# Patient Record
Sex: Female | Born: 1980 | Race: Black or African American | Hispanic: No | Marital: Single | Smoking: Current every day smoker
Health system: Southern US, Community
[De-identification: ages and names within clinical notes are randomized; demographics above are authoritative.]

## PROBLEM LIST (undated history)

## (undated) DIAGNOSIS — R809 Proteinuria, unspecified: Secondary | ICD-10-CM

## (undated) DIAGNOSIS — I10 Essential (primary) hypertension: Secondary | ICD-10-CM

## (undated) DIAGNOSIS — E213 Hyperparathyroidism, unspecified: Secondary | ICD-10-CM

## (undated) DIAGNOSIS — N189 Chronic kidney disease, unspecified: Secondary | ICD-10-CM

## (undated) DIAGNOSIS — N183 Chronic kidney disease, stage 3 unspecified: Secondary | ICD-10-CM

## (undated) DIAGNOSIS — F419 Anxiety disorder, unspecified: Secondary | ICD-10-CM

## (undated) HISTORY — DX: Hyperparathyroidism, unspecified: E21.3

## (undated) HISTORY — DX: Chronic kidney disease, stage 3 unspecified: N18.30

## (undated) HISTORY — DX: Proteinuria, unspecified: R80.9

## (undated) HISTORY — DX: Chronic kidney disease, unspecified: N18.9

---

## 2011-10-17 DIAGNOSIS — Z8249 Family history of ischemic heart disease and other diseases of the circulatory system: Secondary | ICD-10-CM | POA: Insufficient documentation

## 2013-08-29 DIAGNOSIS — R87612 Low grade squamous intraepithelial lesion on cytologic smear of cervix (LGSIL): Secondary | ICD-10-CM

## 2013-08-29 HISTORY — DX: Low grade squamous intraepithelial lesion on cytologic smear of cervix (LGSIL): R87.612

## 2014-06-24 DIAGNOSIS — R87612 Low grade squamous intraepithelial lesion on cytologic smear of cervix (LGSIL): Secondary | ICD-10-CM | POA: Insufficient documentation

## 2019-05-13 ENCOUNTER — Encounter (HOSPITAL_COMMUNITY): Payer: Self-pay

## 2019-05-13 ENCOUNTER — Emergency Department (HOSPITAL_COMMUNITY)
Admission: EM | Admit: 2019-05-13 | Discharge: 2019-05-13 | Disposition: A | Discharge status: Home / Self Care | Payer: No Typology Code available for payment source | Attending: Emergency Medicine | Admitting: Emergency Medicine

## 2019-05-13 ENCOUNTER — Emergency Department (HOSPITAL_COMMUNITY): Payer: No Typology Code available for payment source

## 2019-05-13 ENCOUNTER — Other Ambulatory Visit: Payer: Self-pay

## 2019-05-13 DIAGNOSIS — S80212A Abrasion, left knee, initial encounter: Secondary | ICD-10-CM | POA: Insufficient documentation

## 2019-05-13 DIAGNOSIS — Y92009 Unspecified place in unspecified non-institutional (private) residence as the place of occurrence of the external cause: Secondary | ICD-10-CM | POA: Insufficient documentation

## 2019-05-13 DIAGNOSIS — S99911A Unspecified injury of right ankle, initial encounter: Secondary | ICD-10-CM

## 2019-05-13 DIAGNOSIS — Y999 Unspecified external cause status: Secondary | ICD-10-CM | POA: Diagnosis not present

## 2019-05-13 DIAGNOSIS — I10 Essential (primary) hypertension: Secondary | ICD-10-CM | POA: Insufficient documentation

## 2019-05-13 DIAGNOSIS — Y9389 Activity, other specified: Secondary | ICD-10-CM | POA: Diagnosis not present

## 2019-05-13 DIAGNOSIS — F17210 Nicotine dependence, cigarettes, uncomplicated: Secondary | ICD-10-CM | POA: Insufficient documentation

## 2019-05-13 DIAGNOSIS — X509XXA Other and unspecified overexertion or strenuous movements or postures, initial encounter: Secondary | ICD-10-CM | POA: Insufficient documentation

## 2019-05-13 HISTORY — DX: Anxiety disorder, unspecified: F41.9

## 2019-05-13 HISTORY — DX: Essential (primary) hypertension: I10

## 2019-05-13 MED ORDER — IBUPROFEN 200 MG PO TABS
600.0000 mg | ORAL_TABLET | Freq: Once | ORAL | Status: AC
  Administered 2019-05-13: 09:00:00 600 mg via ORAL
  Filled 2019-05-13: qty 3

## 2019-05-13 NOTE — ED Provider Notes (Signed)
[facility]-EMERGENCY DEPT Provider Note   CSN: [id number] Arrival date & time: 05/13/19  [id number]     History   Chief Complaint Chief Complaint  Patient presents with  . Ankle Injury    HPI Savannah Martin is a 38 y.o. female w PMHx anxiety, HTN, presenting to the ED with complaint of sudden onset of right ankle pain began this morning after she twisted her ankle stepping in a hole.  She has generalized pain to the ankle but worse on the anterior and lateral aspect.  Pain is worse with movement and palpation.  She did not hit her head or pass out.  She reports a minor abrasion to her left knee.  No medications taken for symptoms.     The history is provided by the patient.    Past Medical History:  Diagnosis Date  . Anxiety   . Hypertension     There are no active problems to display for this patient.   History reviewed. No pertinent surgical history.   OB History   No obstetric history on file.      Home Medications    Prior to Admission medications   Not on File    Family History Family History  Problem Relation Age of Onset  . Hypertension Mother   . Hypertension Father     Social History Social History   Tobacco Use  . Smoking status: Current Every Day Smoker    Packs/day: 0.50    Types: Cigarettes  . Smokeless tobacco: Never Used  Substance Use Topics  . Alcohol use: Yes    Comment: occasionally  . Drug use: Yes    Types: Marijuana    Comment: occasionally     Allergies   Patient has no known allergies.   Review of Systems Review of Systems  Musculoskeletal: Positive for arthralgias and joint swelling.  Skin: Negative for wound.  Neurological: Negative for numbness.     Physical Exam Updated Vital Signs BP (!) 163/109 (BP Location: Left Arm)   Pulse (!) 108   Temp 98 F (36.7 C) (Oral)   Resp 18   Ht 5\' 3"  (1.6 m)   Wt 79.4 kg   LMP 05/06/2019   SpO2 100%   BMI 31.00 kg/m   Physical Exam Vitals  signs and nursing note reviewed.  Constitutional:      General: She is not in acute distress.    Appearance: She is well-developed.  HENT:     Head: Normocephalic and atraumatic.  Eyes:     Conjunctiva/sclera: Conjunctivae normal.  Cardiovascular:     Rate and Rhythm: Normal rate.  Pulmonary:     Effort: Pulmonary effort is normal.  Musculoskeletal:     Comments: Right ankle with mild swelling.  No deformity or bruising.  No wounds.  There is generalized tenderness surrounding the ankle though worse in the lateral malleolus.  Pain with range of motion of the ankle.  Foot is benign.  Knee is benign.  Normal sensation intact DP pulse.  Neurological:     Mental Status: She is alert.  Psychiatric:        Mood and Affect: Mood normal.        Behavior: Behavior normal.      ED Treatments / Results  Labs (all labs ordered are listed, but only abnormal results are displayed) Labs Reviewed - No data to display  EKG None  Radiology Dg Ankle Complete Right  Result Date: 05/13/2019 CLINICAL DATA:  Acute  right ankle pain and swelling after injury today EXAM: RIGHT ANKLE - COMPLETE 3+ VIEW COMPARISON:  None. FINDINGS: There is no evidence of fracture, dislocation, or joint effusion. There is no evidence of arthropathy or other focal bone abnormality. Soft tissues are unremarkable. IMPRESSION: Negative. Electronically Signed   By: Marijo Conception M.D.   On: 05/13/2019 08:41    Procedures Procedures (including critical care time)  Medications Ordered in ED Medications  ibuprofen (ADVIL) tablet 600 mg (has no administration in time range)     Initial Impression / Assessment and Plan / ED Course  I have reviewed the triage vital signs and the nursing notes.  Pertinent labs & imaging results that were available during my care of the patient were reviewed by me and considered in my medical decision making (see chart for details).        Patient with right ankle pain likely sprain  after twisting her ankle by stepping in a hole this morning.  No head trauma or LOC.  X-rays negative for acute fracture.  Will discharge an ASO brace, crutches, recommend R ICE therapy and NSAIDs.  Outpatient follow-up.  Safe for discharge.  Discussed results, findings, treatment and follow up. Patient advised of return precautions. Patient verbalized understanding and agreed with plan.   Final Clinical Impressions(s) / ED Diagnoses   Final diagnoses:  Injury of right ankle, initial encounter    ED Discharge Orders    None       , [place] N, PA-C 05/13/19 [place], [place], DO 05/13/19 [phone]

## 2019-05-13 NOTE — ED Notes (Signed)
Ice pack applied to right ankle; pt right ankle elevated in chair.

## 2019-05-13 NOTE — Discharge Instructions (Addendum)
Please read instructions below. Apply ice to your ankle for 20 minutes at a time. Elevate it as much as possible to help with swelling. Avoid weight bearing for at least 1 week then slowly bear weight as tolerated. You can take ibuprofen every 6 hours as needed for pain. Schedule an appointment with the orthopedic specialist in 2 weeks for follow-up on your injury if symptoms persist. Return to the ER for new or concerning symptoms.

## 2019-05-13 NOTE — ED Triage Notes (Signed)
Patient states she came out of her house in the dark this AM and fell in a hole and twisted her right ankle. Patient has an abrasion to the left knee.

## 2019-05-21 ENCOUNTER — Encounter (HOSPITAL_COMMUNITY): Payer: Self-pay

## 2019-05-21 ENCOUNTER — Other Ambulatory Visit: Payer: Self-pay

## 2019-05-21 ENCOUNTER — Ambulatory Visit (HOSPITAL_COMMUNITY)
Admission: EM | Admit: 2019-05-21 | Discharge: 2019-05-21 | Disposition: A | Discharge status: Home / Self Care | Payer: PRIVATE HEALTH INSURANCE | Attending: Family Medicine | Admitting: Family Medicine

## 2019-05-21 DIAGNOSIS — S93491A Sprain of other ligament of right ankle, initial encounter: Secondary | ICD-10-CM

## 2019-05-21 DIAGNOSIS — I10 Essential (primary) hypertension: Secondary | ICD-10-CM

## 2019-05-21 MED ORDER — DICLOFENAC SODIUM 1 % TD GEL: 2.0000 g | Freq: Four times a day (QID) | TRANSDERMAL | 0 refills | Status: DC

## 2019-05-21 NOTE — Discharge Instructions (Signed)
Your blood pressure was noted to be elevated during your visit today. You may return here within the next few days to recheck if unable to see your primary care doctor. If your blood pressure remains persistently elevated, you may need to adjust your medications.  BP (!) 173/108 (BP Location: Right Arm)    Pulse (!) 101    Temp 98.3 F (36.8 C) (Oral)    Resp 16    LMP 05/06/2019    SpO2 100%

## 2019-05-21 NOTE — ED Triage Notes (Signed)
Patient presents to the UC for follow up, she sprain her right ankle 05/13/2019 and was treated at [facility] ED.

## 2019-05-21 NOTE — ED Provider Notes (Addendum)
[facility]   [id number] 05/21/19 Arrival Time: 1358  ASSESSMENT & PLAN:  1. Sprain of anterior talofibular ligament of right ankle, initial encounter   2. Essential hypertension     No indication for re-imaging ankle. Continue ASO brace. Will work more on weight bearing.  Meds ordered this encounter  Medications  . diclofenac sodium (VOLTAREN) 1 % GEL    Sig: Apply 2 g topically 4 (four) times daily.    Dispense:  100 g    Refill:  0   Recommend: Follow-up Information    Schedule an appointment as soon as possible for a visit  with [facility].   Contact information: [address] [place] [address] [phone]         Work note provided. She will call her PCP re: elevated BP here and in ED. Taking medications as directed.  Reviewed expectations re: course of current medical issues. Questions answered. Outlined signs and symptoms indicating need for more acute intervention. Patient verbalized understanding. After Visit Summary given.  SUBJECTIVE: History from: patient. Savannah Martin is a 38 y.o. female who reports spraining her ankle on 05/13/2019. Seen in ED. Imaging without fracture. Given crutches and ASO. She has started weight bearing 1-2 days ago "but it's making my ankle feel sore". Lateral swelling has not worsened; somewhat better after elevating ankle. No extremity sensation changes or weakness. Aleve with some help. Pain does not wake her at night. History of similar: no.  History reviewed. No pertinent surgical history.   Increased blood pressure noted today. Reports that she is treated for HTN.  She reports no chest pain on exertion, no dyspnea on exertion, no swelling of ankles, no orthostatic dizziness or lightheadedness, no orthopnea or paroxysmal nocturnal dyspnea, no palpitations and no intermittent claudication symptoms.  ROS: As per HPI. All other systems negative.    OBJECTIVE:   Vitals:   05/21/19 1416  BP: (!) 173/108  Pulse: (!) 101  Resp: 16  Temp: 98.3 F (36.8 C)  TempSrc: Oral  SpO2: 100%    General appearance: alert; no distress HEENT: [place]; AT Neck: supple with FROM CV: RRR Resp: unlabored respirations Extremities: . LLE: warm and well perfused; fairly well localized mild to moderate tenderness over right lateral ankle; without gross deformities; with mild swelling; without bruising; ROM: normal with reported discomfort CV: brisk extremity capillary refill of RLE; 2+ DP/PT pulses of RLE. Skin: warm and dry; no visible rashes Neurologic: normal reflexes of RLE; normal sensation of RLE; normal strength of RLE Psychological: alert and cooperative; normal mood and affect   Allergies  Allergen Reactions  . Latex Rash  . Sulfa Antibiotics Rash    Past Medical History:  Diagnosis Date  . Anxiety   . Hypertension    Social History   Socioeconomic History  . Marital status: Single    Spouse name: Not on file  . Number of children: Not on file  . Years of education: Not on file  . Highest education level: Not on file  Occupational History  . Not on file  Social Needs  . Financial resource strain: Not on file  . Food insecurity    Worry: Not on file    Inability: Not on file  . Transportation needs    Medical: Not on file    Non-medical: Not on file  Tobacco Use  . Smoking status: Current Every Day Smoker    Packs/day: 0.50    Types:  Cigarettes  . Smokeless tobacco: Never Used  Substance and Sexual Activity  . Alcohol use: Yes    Comment: occasionally  . Drug use: Yes    Types: Marijuana    Comment: occasionally  . Sexual activity: Not on file  Lifestyle  . Physical activity    Days per week: Not on file    Minutes per session: Not on file  . Stress: Not on file  Relationships  . Social Herbalist on phone: Not on file    Gets together: Not on file    Attends religious service: Not on file    Active member of  club or organization: Not on file    Attends meetings of clubs or organizations: Not on file    Relationship status: Not on file  Other Topics Concern  . Not on file  Social History Narrative  . Not on file   Family History  Problem Relation Age of Onset  . Hypertension Mother   . Hypertension Father    History reviewed. No pertinent surgical history.    Vanessa Kick, MD 05/21/19 1517    Vanessa Kick, MD 05/21/19 1157    Vanessa Kick, MD 05/21/19 [phone]

## 2019-05-29 ENCOUNTER — Other Ambulatory Visit: Payer: Self-pay

## 2019-05-29 ENCOUNTER — Encounter: Payer: Self-pay | Admitting: Sports Medicine

## 2019-05-29 ENCOUNTER — Ambulatory Visit (INDEPENDENT_AMBULATORY_CARE_PROVIDER_SITE_OTHER): Payer: No Typology Code available for payment source | Admitting: Sports Medicine

## 2019-05-29 VITALS — BP 152/102 | Ht 63.0 in | Wt 178.0 lb

## 2019-05-29 DIAGNOSIS — IMO0001 Reserved for inherently not codable concepts without codable children: Secondary | ICD-10-CM

## 2019-05-29 DIAGNOSIS — S93401A Sprain of unspecified ligament of right ankle, initial encounter: Secondary | ICD-10-CM

## 2019-05-29 NOTE — Progress Notes (Addendum)
[facility] [address] [place], [place] [address] Phone: [phone] Fax: [phone]   Patient Name: Savannah Martin Date of Birth: 10-31-1980 Medical Record Number: [id number] Gender: female Date of Encounter: 05/29/2019  CC: Right ankle pain  HPI: Pt is here c/o right ankle pain. Pain started 2 weeks ago after twisting her ankle walking outside on with work.  Aggravating factors include flexion and FWB. Alleviating factors include rest and Aleve. No radiating symptoms. No hx of trauma or injury to area in the past.  SHe was seen at [facility] on the day of injury and placed in a boot for 7 days.  That she was transitioned to an Ace wrap.  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  She is concerned that the Aleve is raising her blood pressure.  Past Medical History:  Diagnosis Date  . Anxiety   . Hypertension     Current Outpatient Medications on File Prior to Visit  Medication Sig Dispense Refill  . ALPRAZolam (XANAX) 0.5 MG tablet TAKE 1/2 TO 1 TABLET UP TO 3 TIMES A DAY AS NEEDED    . cloNIDine (CATAPRES) 0.1 MG tablet     . cyclobenzaprine (FLEXERIL) 10 MG tablet TAKE 1/2 TO 1 TABLET BY MOUTH THREE TIMES A DAY AS NEEDED    . diclofenac sodium (VOLTAREN) 1 % GEL Apply 2 g topically 4 (four) times daily. 100 g 0  . diltiazem (CARDIZEM CD) 240 MG 24 hr capsule     . furosemide (LASIX) 20 MG tablet TAKE 1/2 TABLET BY MOUTH ONCE A DAY    . amLODipine (NORVASC) 10 MG tablet Take by mouth.    . norethindrone (MICRONOR) 0.35 MG tablet TAKE 1 TABLET BY ORAL ROUTE  EVERY DAY     No current facility-administered medications on file prior to visit.     No past surgical history on file.  Allergies  Allergen Reactions  . Latex Rash  . Sulfa Antibiotics Rash    Social History   Socioeconomic History  . Marital status: Single    Spouse name: Not on file  . Number of  children: Not on file  . Years of education: Not on file  . Highest education level: Not on file  Occupational History  . Not on file  Social Needs  . Financial resource strain: Not on file  . Food insecurity    Worry: Not on file    Inability: Not on file  . Transportation needs    Medical: Not on file    Non-medical: Not on file  Tobacco Use  . Smoking status: Current Every Day Smoker    Packs/day: 0.50    Types: Cigarettes  . Smokeless tobacco: Never Used  Substance and Sexual Activity  . Alcohol use: Yes    Comment: occasionally  . Drug use: Yes    Types: Marijuana    Comment: occasionally  . Sexual activity: Not on file  Lifestyle  . Physical activity    Days per week: Not on file    Minutes per session: Not on file  . Stress: Not on file  Relationships  . Social Musician on phone: Not on file    Gets together: Not on file    Attends religious service: Not on file    Active member of club or organization: Not on file  Attends meetings of clubs or organizations: Not on file    Relationship status: Not on file  . Intimate partner violence    Fear of current or ex partner: Not on file    Emotionally abused: Not on file    Physically abused: Not on file    Forced sexual activity: Not on file  Other Topics Concern  . Not on file  Social History Narrative  . Not on file    Family History  Problem Relation Age of Onset  . Hypertension Mother   . Hypertension Father     LMP 05/06/2019   ROS:  See HPI CONST: no F/C, no malaise, no fatigue MSK: See above NEURO: no numbness/tingling, no weakness SKIN: no rash, no lesions HEME: no bleeding, no bruising, no erythema  Objective: GEN: Alert and oriented, NAD Pulm: Breathing unlabored PSY: normal mood, congruent affect  MSK Right Ankle No gross deformity or ecchymoses lateral ankle swelling Decreased ROM in DF painful arc with active DF TTP ATFL and CFL No TTP at the base of fifth  metatarsal or talar dome Negative ant drawer   Positive talar tilt.   Negative syndesmotic compression. Thompsons test negative. NV intact distally.  Left ankle No gross deformity, swelling, ecchymoses FROM TTP Negative ant drawer and talar tilt.   Negative syndesmotic compression. Thompsons test negative. NV intact distally.  Limited MSK exam: Right ankle Ankle joint was scanned in long and short axis.  There was no bony abnormality at lateral malleolus or talus.  There were hypoechoic changes around the ATFL and CFL.  Dynamic testing demonstrated endpoint of the ligament.  Impression: Lateral ankle sprain  Assessment and Plan:  1.  Grade 2 lateral ankle sprain  I believe being in the boot for a week because a lot of stiffness.  We will fit patient for Aircast today and send to physical therapy.  Can continue to use Aleve as it is the most cardioprotective.  Commended icing every day.  And sleep and Ace wrap.  We will follow-up in 2 weeks or sooner if symptoms persist.  If minimal to no improvement at follow-up consider MRI.   Lanier Clam, DO, ATC Sports Medicine Fellow  Addendum:  Patient seen in the office by fellow.  His history, exam, plan of care were precepted with me.  Karlton Lemon MD Kirt Boys

## 2019-05-31 ENCOUNTER — Telehealth: Payer: Self-pay

## 2019-05-31 NOTE — Telephone Encounter (Signed)
Note faxed.

## 2019-06-12 ENCOUNTER — Ambulatory Visit: Payer: PRIVATE HEALTH INSURANCE | Admitting: Sports Medicine

## 2020-02-27 DIAGNOSIS — Z419 Encounter for procedure for purposes other than remedying health state, unspecified: Secondary | ICD-10-CM | POA: Diagnosis not present

## 2020-03-29 DIAGNOSIS — Z419 Encounter for procedure for purposes other than remedying health state, unspecified: Secondary | ICD-10-CM | POA: Diagnosis not present

## 2020-04-29 DIAGNOSIS — Z419 Encounter for procedure for purposes other than remedying health state, unspecified: Secondary | ICD-10-CM | POA: Diagnosis not present

## 2020-05-29 DIAGNOSIS — Z419 Encounter for procedure for purposes other than remedying health state, unspecified: Secondary | ICD-10-CM | POA: Diagnosis not present

## 2020-06-29 DIAGNOSIS — Z419 Encounter for procedure for purposes other than remedying health state, unspecified: Secondary | ICD-10-CM | POA: Diagnosis not present

## 2020-07-29 DIAGNOSIS — Z419 Encounter for procedure for purposes other than remedying health state, unspecified: Secondary | ICD-10-CM | POA: Diagnosis not present

## 2020-08-29 DIAGNOSIS — Z419 Encounter for procedure for purposes other than remedying health state, unspecified: Secondary | ICD-10-CM | POA: Diagnosis not present

## 2020-09-29 DIAGNOSIS — Z419 Encounter for procedure for purposes other than remedying health state, unspecified: Secondary | ICD-10-CM | POA: Diagnosis not present

## 2020-10-27 DIAGNOSIS — Z419 Encounter for procedure for purposes other than remedying health state, unspecified: Secondary | ICD-10-CM | POA: Diagnosis not present

## 2020-11-27 DIAGNOSIS — Z419 Encounter for procedure for purposes other than remedying health state, unspecified: Secondary | ICD-10-CM | POA: Diagnosis not present

## 2020-12-27 DIAGNOSIS — Z419 Encounter for procedure for purposes other than remedying health state, unspecified: Secondary | ICD-10-CM | POA: Diagnosis not present

## 2021-01-27 DIAGNOSIS — Z419 Encounter for procedure for purposes other than remedying health state, unspecified: Secondary | ICD-10-CM | POA: Diagnosis not present

## 2021-02-26 DIAGNOSIS — Z419 Encounter for procedure for purposes other than remedying health state, unspecified: Secondary | ICD-10-CM | POA: Diagnosis not present

## 2021-03-27 IMAGING — CR DG ANKLE COMPLETE 3+V*R*
3 series · 3 of 3 positions shown · non-contrast
Comparison: None.

CLINICAL DATA: Acute right ankle pain and swelling after injury
today

EXAM:
RIGHT ANKLE - COMPLETE 3+ VIEW

[x ankle ap right]
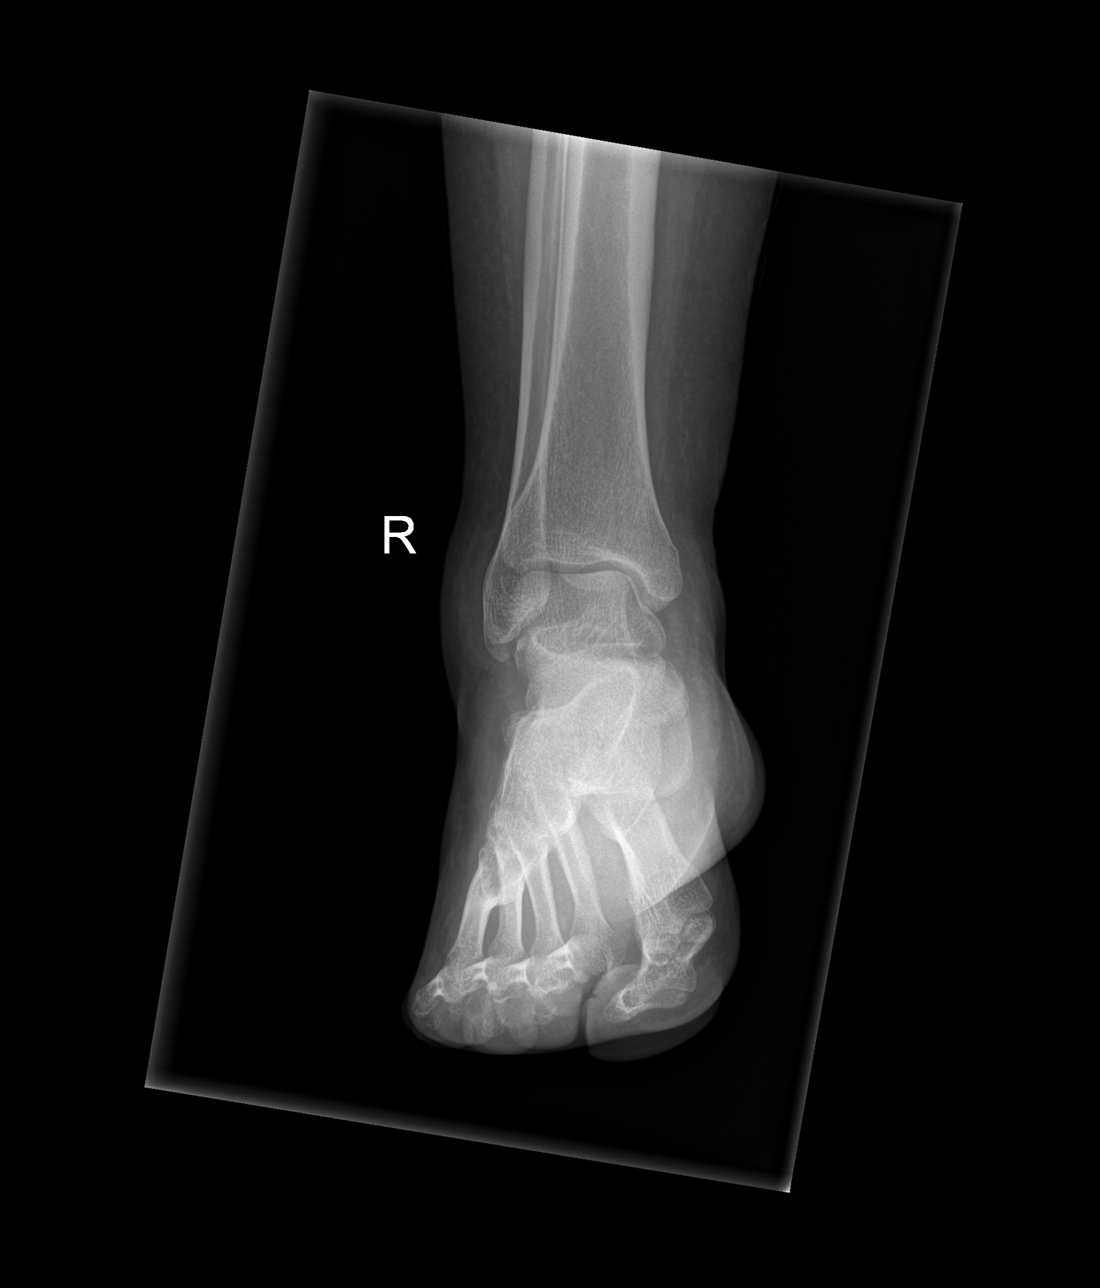

[x ankle obl right]
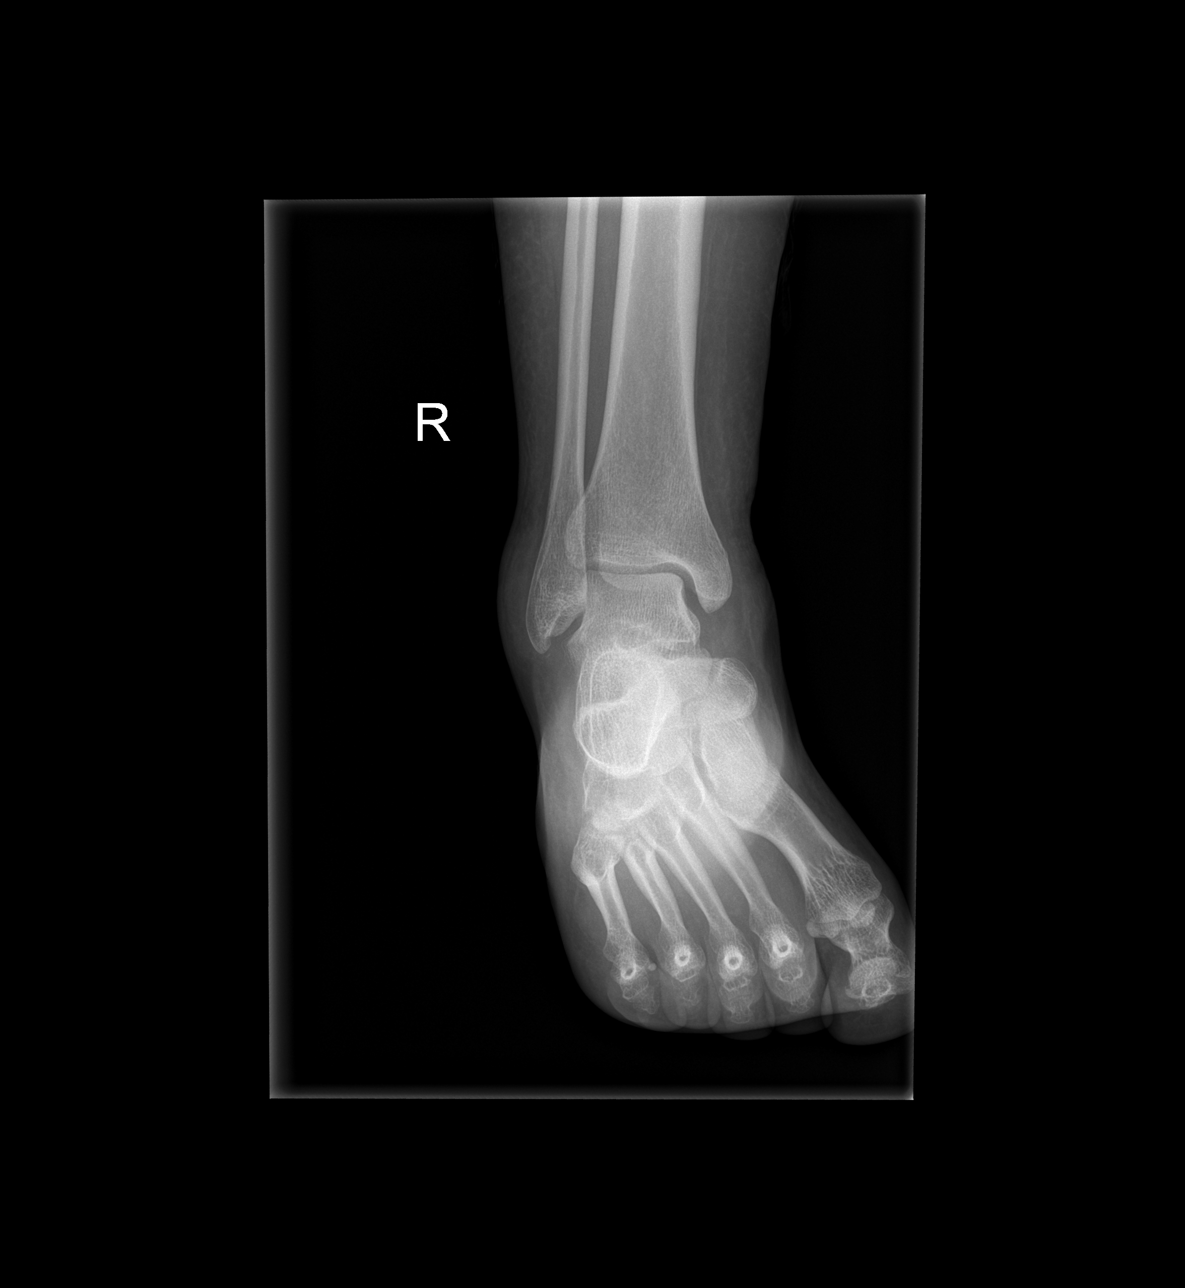

[x ankle lat right]
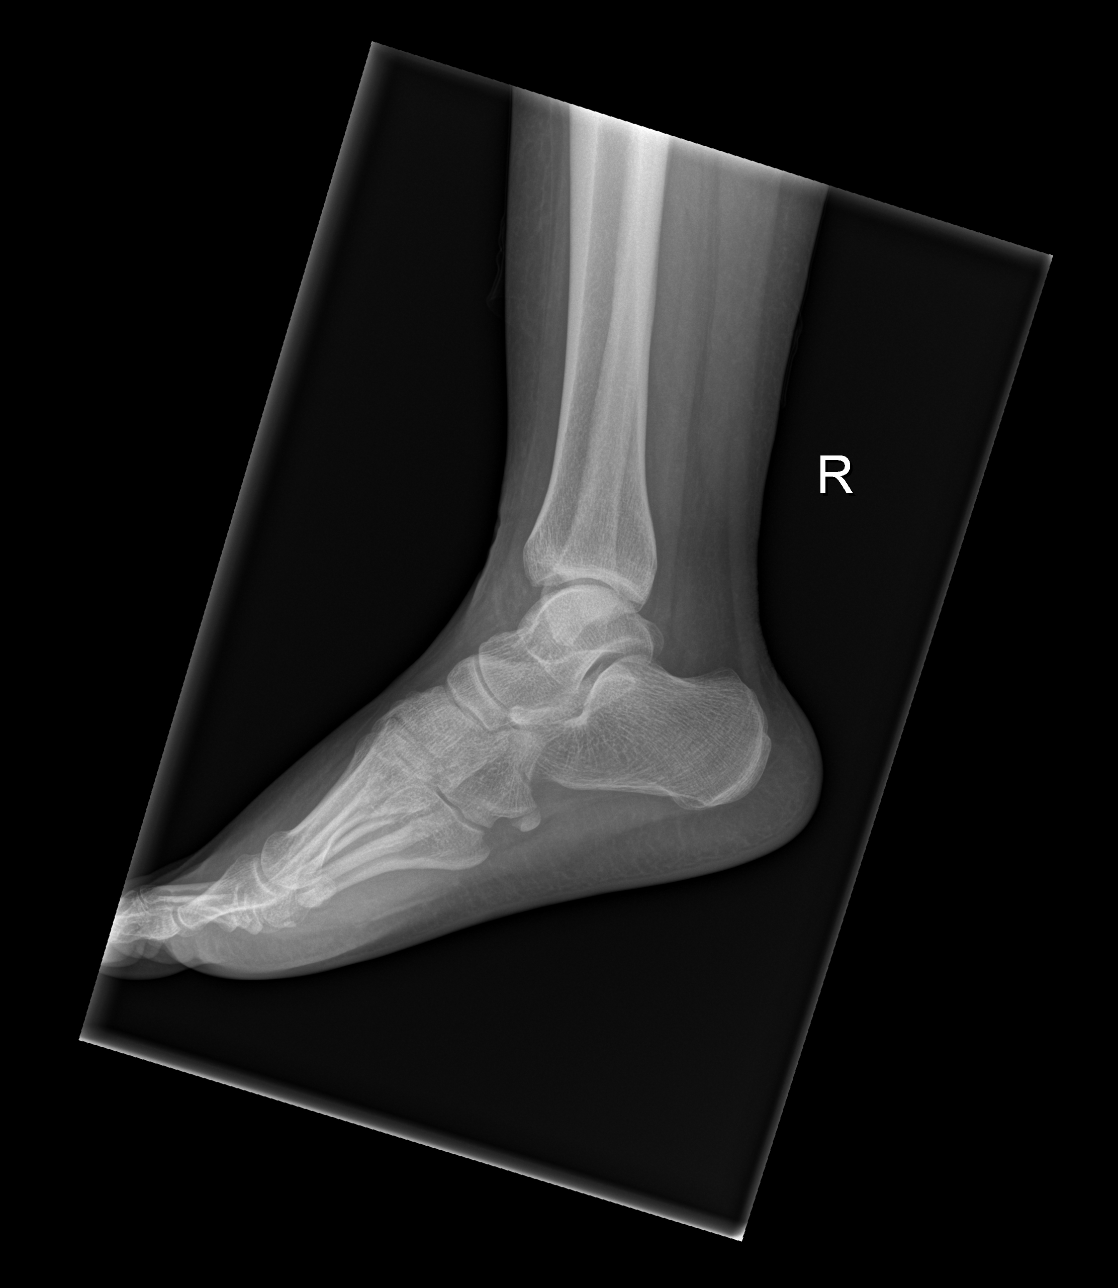

[3 of 3 positions shown; findings below may reference images not displayed]

FINDINGS: There is no evidence of fracture, dislocation, or joint effusion.
There is no evidence of arthropathy or other focal bone abnormality.
Soft tissues are unremarkable.
IMPRESSION: Negative.

## 2021-03-29 DIAGNOSIS — Z419 Encounter for procedure for purposes other than remedying health state, unspecified: Secondary | ICD-10-CM | POA: Diagnosis not present

## 2021-04-29 DIAGNOSIS — Z419 Encounter for procedure for purposes other than remedying health state, unspecified: Secondary | ICD-10-CM | POA: Diagnosis not present

## 2021-05-29 DIAGNOSIS — Z419 Encounter for procedure for purposes other than remedying health state, unspecified: Secondary | ICD-10-CM | POA: Diagnosis not present

## 2021-06-29 DIAGNOSIS — Z419 Encounter for procedure for purposes other than remedying health state, unspecified: Secondary | ICD-10-CM | POA: Diagnosis not present

## 2021-07-29 DIAGNOSIS — Z419 Encounter for procedure for purposes other than remedying health state, unspecified: Secondary | ICD-10-CM | POA: Diagnosis not present

## 2021-08-24 DIAGNOSIS — Z79899 Other long term (current) drug therapy: Secondary | ICD-10-CM | POA: Diagnosis not present

## 2021-08-24 DIAGNOSIS — I10 Essential (primary) hypertension: Secondary | ICD-10-CM | POA: Diagnosis not present

## 2021-08-29 DIAGNOSIS — Z419 Encounter for procedure for purposes other than remedying health state, unspecified: Secondary | ICD-10-CM | POA: Diagnosis not present

## 2021-09-29 DIAGNOSIS — Z419 Encounter for procedure for purposes other than remedying health state, unspecified: Secondary | ICD-10-CM | POA: Diagnosis not present

## 2021-10-27 DIAGNOSIS — Z419 Encounter for procedure for purposes other than remedying health state, unspecified: Secondary | ICD-10-CM | POA: Diagnosis not present

## 2021-11-27 DIAGNOSIS — F1210 Cannabis abuse, uncomplicated: Secondary | ICD-10-CM | POA: Diagnosis not present

## 2021-11-27 DIAGNOSIS — R0602 Shortness of breath: Secondary | ICD-10-CM | POA: Diagnosis not present

## 2021-11-27 DIAGNOSIS — Z8759 Personal history of other complications of pregnancy, childbirth and the puerperium: Secondary | ICD-10-CM | POA: Diagnosis not present

## 2021-11-27 DIAGNOSIS — I160 Hypertensive urgency: Secondary | ICD-10-CM | POA: Diagnosis not present

## 2021-11-27 DIAGNOSIS — Z20822 Contact with and (suspected) exposure to covid-19: Secondary | ICD-10-CM | POA: Diagnosis not present

## 2021-11-27 DIAGNOSIS — R519 Headache, unspecified: Secondary | ICD-10-CM | POA: Diagnosis not present

## 2021-11-27 DIAGNOSIS — R0989 Other specified symptoms and signs involving the circulatory and respiratory systems: Secondary | ICD-10-CM | POA: Diagnosis not present

## 2021-11-27 DIAGNOSIS — R000 Tachycardia, unspecified: Secondary | ICD-10-CM | POA: Diagnosis not present

## 2021-11-27 DIAGNOSIS — Z882 Allergy status to sulfonamides status: Secondary | ICD-10-CM | POA: Diagnosis not present

## 2021-11-27 DIAGNOSIS — F17210 Nicotine dependence, cigarettes, uncomplicated: Secondary | ICD-10-CM | POA: Diagnosis not present

## 2021-11-27 DIAGNOSIS — Z419 Encounter for procedure for purposes other than remedying health state, unspecified: Secondary | ICD-10-CM | POA: Diagnosis not present

## 2021-11-27 DIAGNOSIS — Z79899 Other long term (current) drug therapy: Secondary | ICD-10-CM | POA: Diagnosis not present

## 2021-12-07 DIAGNOSIS — E669 Obesity, unspecified: Secondary | ICD-10-CM | POA: Diagnosis not present

## 2021-12-07 DIAGNOSIS — F321 Major depressive disorder, single episode, moderate: Secondary | ICD-10-CM | POA: Diagnosis not present

## 2021-12-07 DIAGNOSIS — Z6835 Body mass index (BMI) 35.0-35.9, adult: Secondary | ICD-10-CM | POA: Diagnosis not present

## 2021-12-07 DIAGNOSIS — F17210 Nicotine dependence, cigarettes, uncomplicated: Secondary | ICD-10-CM | POA: Diagnosis not present

## 2021-12-07 DIAGNOSIS — I10 Essential (primary) hypertension: Secondary | ICD-10-CM | POA: Diagnosis not present

## 2021-12-07 DIAGNOSIS — R0602 Shortness of breath: Secondary | ICD-10-CM | POA: Diagnosis not present

## 2021-12-07 DIAGNOSIS — F411 Generalized anxiety disorder: Secondary | ICD-10-CM | POA: Diagnosis not present

## 2021-12-16 DIAGNOSIS — I10 Essential (primary) hypertension: Secondary | ICD-10-CM | POA: Diagnosis not present

## 2021-12-16 DIAGNOSIS — O169 Unspecified maternal hypertension, unspecified trimester: Secondary | ICD-10-CM | POA: Diagnosis not present

## 2021-12-16 DIAGNOSIS — F17210 Nicotine dependence, cigarettes, uncomplicated: Secondary | ICD-10-CM | POA: Diagnosis not present

## 2021-12-16 DIAGNOSIS — E213 Hyperparathyroidism, unspecified: Secondary | ICD-10-CM | POA: Diagnosis not present

## 2021-12-16 DIAGNOSIS — Z79899 Other long term (current) drug therapy: Secondary | ICD-10-CM | POA: Diagnosis not present

## 2021-12-16 DIAGNOSIS — N1831 Chronic kidney disease, stage 3a: Secondary | ICD-10-CM | POA: Diagnosis not present

## 2021-12-16 DIAGNOSIS — F1290 Cannabis use, unspecified, uncomplicated: Secondary | ICD-10-CM | POA: Diagnosis not present

## 2021-12-16 DIAGNOSIS — N1832 Chronic kidney disease, stage 3b: Secondary | ICD-10-CM | POA: Diagnosis not present

## 2021-12-16 DIAGNOSIS — I129 Hypertensive chronic kidney disease with stage 1 through stage 4 chronic kidney disease, or unspecified chronic kidney disease: Secondary | ICD-10-CM | POA: Diagnosis not present

## 2021-12-27 DIAGNOSIS — Z419 Encounter for procedure for purposes other than remedying health state, unspecified: Secondary | ICD-10-CM | POA: Diagnosis not present

## 2021-12-31 DIAGNOSIS — E213 Hyperparathyroidism, unspecified: Secondary | ICD-10-CM | POA: Diagnosis not present

## 2021-12-31 DIAGNOSIS — N189 Chronic kidney disease, unspecified: Secondary | ICD-10-CM | POA: Diagnosis not present

## 2021-12-31 DIAGNOSIS — E042 Nontoxic multinodular goiter: Secondary | ICD-10-CM | POA: Diagnosis not present

## 2022-01-27 DIAGNOSIS — Z419 Encounter for procedure for purposes other than remedying health state, unspecified: Secondary | ICD-10-CM | POA: Diagnosis not present

## 2022-02-26 DIAGNOSIS — Z419 Encounter for procedure for purposes other than remedying health state, unspecified: Secondary | ICD-10-CM | POA: Diagnosis not present

## 2022-03-29 DIAGNOSIS — Z419 Encounter for procedure for purposes other than remedying health state, unspecified: Secondary | ICD-10-CM | POA: Diagnosis not present

## 2022-04-29 DIAGNOSIS — Z419 Encounter for procedure for purposes other than remedying health state, unspecified: Secondary | ICD-10-CM | POA: Diagnosis not present

## 2022-05-29 DIAGNOSIS — Z419 Encounter for procedure for purposes other than remedying health state, unspecified: Secondary | ICD-10-CM | POA: Diagnosis not present

## 2022-06-29 DIAGNOSIS — Z419 Encounter for procedure for purposes other than remedying health state, unspecified: Secondary | ICD-10-CM | POA: Diagnosis not present

## 2022-07-29 DIAGNOSIS — Z419 Encounter for procedure for purposes other than remedying health state, unspecified: Secondary | ICD-10-CM | POA: Diagnosis not present

## 2022-08-29 DIAGNOSIS — Z419 Encounter for procedure for purposes other than remedying health state, unspecified: Secondary | ICD-10-CM | POA: Diagnosis not present

## 2022-09-13 ENCOUNTER — Ambulatory Visit: Payer: PRIVATE HEALTH INSURANCE | Admitting: Family

## 2022-09-13 ENCOUNTER — Ambulatory Visit
Admission: EM | Admit: 2022-09-13 | Discharge: 2022-09-13 | Disposition: A | Discharge status: Home / Self Care | Payer: Medicaid Other

## 2022-09-13 ENCOUNTER — Encounter: Payer: Self-pay | Admitting: Emergency Medicine

## 2022-09-13 ENCOUNTER — Other Ambulatory Visit: Payer: Self-pay

## 2022-09-13 DIAGNOSIS — I10 Essential (primary) hypertension: Secondary | ICD-10-CM

## 2022-09-13 MED ORDER — DILTIAZEM HCL ER COATED BEADS 240 MG PO CP24: 240.0000 mg | ORAL_CAPSULE | Freq: Every day | ORAL | 0 refills | Status: DC

## 2022-09-13 MED ORDER — LABETALOL HCL 200 MG PO TABS: 400.0000 mg | ORAL_TABLET | Freq: Two times a day (BID) | ORAL | 0 refills | Status: DC

## 2022-09-13 MED ORDER — CLONIDINE HCL 0.1 MG PO TABS: 0.1000 mg | ORAL_TABLET | Freq: Two times a day (BID) | ORAL | 0 refills | Status: DC

## 2022-09-13 NOTE — ED Provider Notes (Signed)
[facility]    CSN: [id number] Arrival date & time: 09/13/22  0809      History   Chief Complaint Chief Complaint  Patient presents with   Medication Refill    HPI Savannah Martin is a 42 y.o. female.   Patient here today for refill of antihypertensives.  She reports that she has not had medications in about a month.  She had primary care provider appointment this morning however this was canceled last second due to a provider emergency.  She does note occasional chest discomfort, shortness of breath, headache and overall has not been feeling well since being out of medications.  She does not report any current symptoms.  She denies any side effects of medications while she was taking same.  She states she has been on the same medications for several years.  The history is provided by the patient.  Medication Refill   Past Medical History:  Diagnosis Date   Anxiety    Hypertension     There are no problems to display for this patient.   History reviewed. No pertinent surgical history.  OB History   No obstetric history on file.      Home Medications    Prior to Admission medications   Medication Sig Start Date End Date Taking? Authorizing Provider  ALPRAZolam (XANAX) 0.5 MG tablet TAKE 1/2 TO 1 TABLET UP TO 3 TIMES A DAY AS NEEDED 03/27/19   [provider]  cloNIDine (CATAPRES) 0.1 MG tablet Take 1 tablet (0.1 mg total) by mouth 2 (two) times daily. 09/13/22 10/13/22  Francene Finders, PA-C  cyclobenzaprine (FLEXERIL) 10 MG tablet TAKE 1/2 TO 1 TABLET BY MOUTH THREE TIMES A DAY AS NEEDED 03/27/19   [provider]  diclofenac sodium (VOLTAREN) 1 % GEL Apply 2 g topically 4 (four) times daily. 05/21/19   Vanessa Kick, MD  diltiazem (CARDIZEM CD) 240 MG 24 hr capsule Take 1 capsule (240 mg total) by mouth daily. 09/13/22 10/13/22  Francene Finders, PA-C  furosemide (LASIX) 20 MG tablet TAKE 1/2 TABLET BY MOUTH ONCE A DAY 03/27/19   [provider]  labetalol (NORMODYNE) 200 MG tablet Take 2 tablets (400 mg total) by mouth 2 (two) times daily. 09/13/22 10/13/22  Francene Finders, PA-C  norethindrone (MICRONOR) 0.35 MG tablet TAKE 1 TABLET BY ORAL ROUTE  EVERY DAY 03/27/19   [provider]    Family History Family History  Problem Relation Age of Onset   Hypertension Mother    Hypertension Father     Social History Social History   Tobacco Use   Smoking status: Every Day    Packs/day: 0.50    Types: Cigarettes   Smokeless tobacco: Never  Vaping Use   Vaping Use: Never used  Substance Use Topics   Alcohol use: Yes    Comment: occasionally   Drug use: Yes    Types: Marijuana    Comment: occasionally     Allergies   Latex and Sulfa antibiotics   Review of Systems Review of Systems  Constitutional:  Negative for chills and fever.  Eyes:  Negative for discharge and redness.  Respiratory:  Negative for cough and shortness of breath.   Cardiovascular:  Negative for chest pain.  Gastrointestinal:  Negative for nausea and vomiting.  Genitourinary:  Negative for vaginal discharge.  Neurological:  Negative for headaches.     Physical Exam Triage Vital Signs ED Triage Vitals [09/13/22 0849]  Enc Vitals Group  BP (!) 153/106     Pulse Rate 100     Resp 18     Temp 98.5 F (36.9 C)     Temp Source Oral     SpO2 95 %     Weight      Height      Head Circumference      Peak Flow      Pain Score 0     Pain Loc      Pain Edu?      Excl. in [place]?    No data found.  Updated Vital Signs BP (!) 153/106 (BP Location: Left Arm)   Pulse 100   Temp 98.5 F (36.9 C) (Oral)   Resp 18   SpO2 95%    Physical Exam Vitals and nursing note reviewed.  Constitutional:      General: She is not in acute distress.    Appearance: Normal appearance. She is not ill-appearing.  HENT:     Head: Normocephalic and atraumatic.  Eyes:     Conjunctiva/sclera: Conjunctivae normal.  Cardiovascular:      Rate and Rhythm: Normal rate and regular rhythm.  Pulmonary:     Effort: Pulmonary effort is normal. No respiratory distress.     Breath sounds: Normal breath sounds. No wheezing, rhonchi or rales.  Neurological:     Mental Status: She is alert.  Psychiatric:        Mood and Affect: Mood normal.        Behavior: Behavior normal.        Thought Content: Thought content normal.      UC Treatments / Results  Labs (all labs ordered are listed, but only abnormal results are displayed) Labs Reviewed - No data to display  EKG   Radiology No results found.  Procedures Procedures (including critical care time)  Medications Ordered in UC Medications - No data to display  Initial Impression / Assessment and Plan / UC Course  I have reviewed the triage vital signs and the nursing notes.  Pertinent labs & imaging results that were available during my care of the patient were reviewed by me and considered in my medical decision making (see chart for details).    Medications refilled at prior dosing as requested.  Patient has follow-up scheduled with primary care in about 4 days.  Hopefully she will be able to make this appointment.  Encouraged follow-up sooner with any further concerns.  Patient expressed understanding.  Final Clinical Impressions(s) / UC Diagnoses   Final diagnoses:  Essential hypertension   Discharge Instructions   None    ED Prescriptions     Medication Sig Dispense Auth. Provider   cloNIDine (CATAPRES) 0.1 MG tablet Take 1 tablet (0.1 mg total) by mouth 2 (two) times daily. 60 tablet Ewell Poe F, PA-C   diltiazem (CARDIZEM CD) 240 MG 24 hr capsule Take 1 capsule (240 mg total) by mouth daily. 30 capsule Ewell Poe F, PA-C   labetalol (NORMODYNE) 200 MG tablet Take 2 tablets (400 mg total) by mouth 2 (two) times daily. 120 tablet Francene Finders, PA-C      PDMP not reviewed this encounter.   Francene Finders, PA-C 09/13/22 [phone]

## 2022-09-13 NOTE — ED Triage Notes (Signed)
Pt here for htn med refill; pt sts out x 1 month; had PCP appointment today but they cancelled due to provider emergency

## 2022-09-29 DIAGNOSIS — Z419 Encounter for procedure for purposes other than remedying health state, unspecified: Secondary | ICD-10-CM | POA: Diagnosis not present

## 2022-10-18 ENCOUNTER — Ambulatory Visit (INDEPENDENT_AMBULATORY_CARE_PROVIDER_SITE_OTHER): Payer: Medicaid Other | Admitting: Nurse Practitioner

## 2022-10-18 ENCOUNTER — Encounter: Payer: Self-pay | Admitting: Nurse Practitioner

## 2022-10-18 VITALS — BP 164/83 | HR 76 | Temp 97.8°F | Ht 63.0 in | Wt 196.8 lb

## 2022-10-18 DIAGNOSIS — F329 Major depressive disorder, single episode, unspecified: Secondary | ICD-10-CM | POA: Insufficient documentation

## 2022-10-18 DIAGNOSIS — I10 Essential (primary) hypertension: Secondary | ICD-10-CM | POA: Diagnosis not present

## 2022-10-18 DIAGNOSIS — E041 Nontoxic single thyroid nodule: Secondary | ICD-10-CM | POA: Diagnosis not present

## 2022-10-18 DIAGNOSIS — M545 Low back pain, unspecified: Secondary | ICD-10-CM

## 2022-10-18 DIAGNOSIS — G8929 Other chronic pain: Secondary | ICD-10-CM

## 2022-10-18 DIAGNOSIS — Z131 Encounter for screening for diabetes mellitus: Secondary | ICD-10-CM | POA: Insufficient documentation

## 2022-10-18 DIAGNOSIS — E212 Other hyperparathyroidism: Secondary | ICD-10-CM | POA: Insufficient documentation

## 2022-10-18 DIAGNOSIS — F32A Depression, unspecified: Secondary | ICD-10-CM | POA: Diagnosis not present

## 2022-10-18 DIAGNOSIS — Z716 Tobacco abuse counseling: Secondary | ICD-10-CM | POA: Insufficient documentation

## 2022-10-18 DIAGNOSIS — E213 Hyperparathyroidism, unspecified: Secondary | ICD-10-CM | POA: Diagnosis not present

## 2022-10-18 DIAGNOSIS — F419 Anxiety disorder, unspecified: Secondary | ICD-10-CM | POA: Diagnosis not present

## 2022-10-18 DIAGNOSIS — R7989 Other specified abnormal findings of blood chemistry: Secondary | ICD-10-CM | POA: Diagnosis not present

## 2022-10-18 HISTORY — DX: Other chronic pain: G89.29

## 2022-10-18 HISTORY — DX: Low back pain, unspecified: M54.50

## 2022-10-18 MED ORDER — CYCLOBENZAPRINE HCL 5 MG PO TABS: 5.0000 mg | ORAL_TABLET | Freq: Three times a day (TID) | ORAL | 1 refills | Status: DC | PRN

## 2022-10-18 MED ORDER — LABETALOL HCL 200 MG PO TABS: 400.0000 mg | ORAL_TABLET | Freq: Two times a day (BID) | ORAL | 1 refills | Status: DC

## 2022-10-18 MED ORDER — SERTRALINE HCL 100 MG PO TABS: 100.0000 mg | ORAL_TABLET | Freq: Every day | ORAL | 3 refills | Status: DC

## 2022-10-18 MED ORDER — CLONIDINE HCL 0.2 MG PO TABS: 0.2000 mg | ORAL_TABLET | Freq: Two times a day (BID) | ORAL | 0 refills | Status: DC

## 2022-10-18 MED ORDER — DILTIAZEM HCL ER COATED BEADS 240 MG PO CP24: 240.0000 mg | ORAL_CAPSULE | Freq: Every day | ORAL | 0 refills | Status: DC

## 2022-10-18 NOTE — Patient Instructions (Addendum)
1. Thyroid nodule  - Ambulatory referral to Endocrinology  2. Hypertension, unspecified type  - diltiazem (CARDIZEM CD) 240 MG 24 hr capsule; Take 1 capsule (240 mg total) by mouth daily.  Dispense: 90 capsule; Refill: 0 - labetalol (NORMODYNE) 200 MG tablet; Take 2 tablets (400 mg total) by mouth 2 (two) times daily.  Dispense: 240 tablet; Refill: 1 - Ambulatory referral to Nephrology - cloNIDine (CATAPRES) 0.2 MG tablet; Take 1 tablet (0.2 mg total) by mouth 2 (two) times daily.  Dispense: 180 tablet; Refill: 0 - CMP14+EGFR - AMB Referral to Pharmacy Medication Management  3. Anxiety and depression  - Ambulatory referral to Psychiatry - sertraline (ZOLOFT) 100 MG tablet; Take 1 tablet (100 mg total) by mouth daily.  Dispense: 30 tablet; Refill: 3  4. Elevated PTHrP level  - Ambulatory referral to Endocrinology - CMP14+EGFR - PTH, Intact and Calcium   7. Chronic midline low back pain, unspecified whether sciatica present  - cyclobenzaprine (FLEXERIL) 5 MG tablet; Take 1 tablet (5 mg total) by mouth 3 (three) times daily as needed for muscle spasms.  Dispense: 30 tablet; Refill: 1    It is important that you exercise regularly at least 30 minutes 5 times a week as tolerated  Think about what you will eat, plan ahead. Choose " clean, green, fresh or frozen" over canned, processed or packaged foods which are more sugary, salty and fatty. 70 to 75% of food eaten should be vegetables and fruit. Three meals at set times with snacks allowed between meals, but they must be fruit or vegetables. Aim to eat over a 12 hour period , example 7 am to 7 pm, and STOP after  your last meal of the day. Drink water,generally about 64 ounces per day, no other drink is as healthy. Fruit juice is best enjoyed in a healthy way, by EATING the fruit.  Thanks for choosing [facility] we consider it a privelige to serve you.

## 2022-10-18 NOTE — Assessment & Plan Note (Signed)
    10/18/2022   12:18 PM  GAD 7 : Generalized Anxiety Score  Nervous, Anxious, on Edge 2  Control/stop worrying 3  Worry too much - different things 3  Trouble relaxing 3  Restless 3  Easily annoyed or irritable 3  Afraid - awful might happen 0  Total GAD 7 Score 17  Anxiety Difficulty Very difficult  Currently on Zoloft 50 mg daily Xanax 0.5 mg as needed Start Zoloft 100 mg daily She denies SI, HI Patient referred to psych.

## 2022-10-18 NOTE — Assessment & Plan Note (Signed)
Flexeril 5 mg 3 times daily as needed refilled Stretching exercises encouraged

## 2022-10-18 NOTE — Assessment & Plan Note (Signed)
Patient referred to endocrinology and nephrology  12/31/2021 1:09 PM EDT  EXAM:  US SOFT TISSUE HEAD AND NECK  INDICATION:  42 years old Female with hyperparathyroidism, ?primary versus secondary to CKD, E21.3 Hyperparathyroidism, unspecified (CMS-HCC).  TECHNIQUE:  Serial grayscale and limited color Doppler ultrasound images of the thyroid gland were obtained in multiple planes.  COMPARISON:  None available.    FINDINGS:  Thyroid: Numerous thyroid nodules, some of which are detailed below. Many of the nodules are predominantly cystic and not reported below. The thyroid is enlarged. Portions of the thyroid show heterogeneous echotexture. Right lobe:  7.8 x 2.8 x 3.7 cm Left lobe:  7.0 x 2.7 x 2.7 cm Isthmus:  1.5 cm  Nodule 1: Img#  [id number] Location:  Mid right lobe posteriorly Size:  1.5 x 1.5 x 1.0 cm Findings:  Mixed cystic and solid hypoechoic nodule, wider than tall, smooth margins, no echogenic foci. TI-RADS level: TR 3  Nodule 2: Img#  [id number]   Location:  Mid right lobe anteriorly Size:  1.8 x 1.7 x 1.5 cm Findings:  Mixed cystic and solid nodule, hypoechoic, wider than tall, smooth margins, no echogenic foci. TI-RADS level:  TR 3  Nodule 3: Img#  [id number] Location:  Mid right lobe posteriorly Size:  1.1 x 1.2 x 1.5 cm Findings:  Solid hypoechoic nodule, follow than wide, smooth margins, peripheral rim calcifications. TI-RADS level:  TR 5  Nodule 4: Img#  [id number] Location:  Upper left lobe Size:  1.2 x 1.0 x 0.8 cm Findings: Mixed cystic and solid hypoechoic nodule, wider than tall, smooth margins, no echogenic foci. TI-RADS level: TR 3  IMPRESSION:   1. Numerous thyroid nodules, the majority of which are predominantly cystic and with a benign appearance. Additional cystic/solid and solid nodules are detailed above. A solid nodule with peripheral rim calcifications in the mid right lobe posteriorly has a TI-RADS score of TR 5, and FNA of this nodule is recommended  (nodule 3 in the above report).

## 2022-10-18 NOTE — Progress Notes (Signed)
New Patient Office Visit  Subjective:  Patient ID: Savannah Martin, female    DOB: 20-Oct-1980  Age: 42 y.o. MRN: [id number]  CC:  Chief Complaint  Patient presents with   Establish Care    New pt is fasting.    HPI Savannah Martin is a 42 y.o. 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  Previous PCP Dr. Dorene Ar in [place], [place] last visit was in 2023, she was also being followed by nephrology at [facility] last visit was in 2023.  Uncontrolled hypertension/CKD.  She was being followed by nephrology.  Currently on clonidine 0.1 mg twice daily, labetalol 400 mg twice daily, Cardizem 240 mg daily.  She reported some headaches, denies chest pain syncope  Anxiety and depression . States that she is going through a lot dealing with her baby father and kids.  She currently denies SI HI.  Takes Zoloft 50 mg daily, Xanax 0.5 mg 3 times daily as needed  Chronic low back pain .patient complained about chronic low back pain, muscle tightness for the past 1 to 2 years , does a lot of moving and bending on her job .no complaints of numbness, tingling, incontinence, trauma .  She reported that she has had a Tdap vaccine within the last 10 years  Past Medical History:  Diagnosis Date   Anxiety    CKD (chronic kidney disease)    CKD (chronic kidney disease), stage III ([place])    Hyperparathyroidism ([place])    Hypertension    LGSIL on Pap smear of cervix 2015   Proteinuria     History reviewed. No pertinent surgical history.  Family History  Problem Relation Age of Onset   Hypertension Mother    Stroke Mother 32   Anuerysm Mother    Hypertension Father    Epilepsy Sister    Bipolar disorder Sister    Cancer - Cervical Sister    Stroke Maternal Grandmother    Breast cancer Neg Hx    Lung cancer Neg Hx      Social History   Socioeconomic History   Marital status: Single    Spouse name: Not on file   Number of children: 4   Years of education: Not on file   Highest education level: Not on file  Occupational History   Not on file  Tobacco Use   Smoking status: Every Day    Packs/day: 0.50    Types: Cigarettes   Smokeless tobacco: Never   Tobacco comments:    Started smoking cigarettes at age 34, smokes 0.5 [pack daily.   Vaping Use   Vaping Use: Never used  Substance and Sexual Activity   Alcohol use: Yes    Comment: occasionally   Drug use: Yes    Types: Marijuana    Comment: occasionally   Sexual activity: Yes  Other Topics Concern   Not on file  Social History Narrative   Lives with her children    Social Determinants of Health   Financial Resource Strain: Not on file  Food Insecurity: Not on file  Transportation Needs: Not on file  Physical Activity: Not on file  Stress: Not on file  Social Connections: Not on file  Intimate Partner Violence: Not on file    ROS Review of Systems  Constitutional: Negative.   Respiratory: Negative.    Cardiovascular: Negative.  Gastrointestinal: Negative.   Musculoskeletal:  Positive for arthralgias. Negative for myalgias, neck pain and neck stiffness.  Neurological:  Positive for headaches. Negative for dizziness, seizures, facial asymmetry, light-headedness and numbness.  Psychiatric/Behavioral:  Negative for confusion, self-injury and suicidal ideas. The patient is nervous/anxious.     Objective:   Today's Vitals: BP (!) 164/83   Pulse 76   Temp 97.8 F (36.6 C)   Ht 5' 3"$  (1.6 m)   Wt 196 lb 12.8 oz (89.3 kg)   LMP 10/13/2022 (Approximate)   SpO2 100%   BMI 34.86 kg/m   Physical Exam Constitutional:      General: She is not in acute distress.    Appearance: She is obese. She is not ill-appearing, toxic-appearing or diaphoretic.  Cardiovascular:     Rate and Rhythm: Normal rate and regular rhythm.      Pulses: Normal pulses.     Heart sounds: Normal heart sounds. No murmur heard.    No friction rub. No gallop.  Pulmonary:     Effort: Pulmonary effort is normal. No respiratory distress.     Breath sounds: Normal breath sounds. No stridor. No wheezing, rhonchi or rales.  Chest:     Chest wall: No tenderness.  Abdominal:     General: There is no distension.     Palpations: Abdomen is soft.     Tenderness: There is no abdominal tenderness. There is no guarding.  Musculoskeletal:        General: No swelling, tenderness, deformity or signs of injury.     Right lower leg: No edema.     Left lower leg: No edema.  Skin:    General: Skin is warm and dry.  Neurological:     Mental Status: She is alert and oriented to person, place, and time.     Sensory: No sensory deficit.     Motor: No weakness.     Coordination: Coordination normal.     Gait: Gait normal.  Psychiatric:        Mood and Affect: Mood normal.        Behavior: Behavior normal.        Thought Content: Thought content normal.        Judgment: Judgment normal.     Assessment & Plan:   Problem List Items Addressed This Visit       Cardiovascular and Mediastinum   High blood pressure    BP Readings from Last 3 Encounters:  10/18/22 (!) 164/83  09/13/22 (!) 153/106  05/29/19 (!) 152/102  Chronic uncontrolled condition currently on clonidine 0.1 mg twice daily, Cardizem 240 mg daily, labetalol 400 mg twice daily.  Stated that she has been taking all medications daily as ordered.  She reported some headaches denies chest pain, syncope She was seen nephrology but not currently Start Clonidine 0.2 mg daily continue Cardizem 240 mg daily, labetalol 100 mg twice daily DASH diet advised engage in regular moderate to vigorous exercises at least 250 minutes weekly. Patient referred to nephrology Clinical pharmacist consulted to assist with medication management in the meantime Follow-up in 6 weeks. Medications refilled       Relevant Medications   diltiazem (CARDIZEM CD) 240 MG 24 hr capsule   labetalol (NORMODYNE) 200 MG tablet   cloNIDine (CATAPRES) 0.2 MG tablet   Other Relevant Orders   Ambulatory referral to Nephrology   CMP14+EGFR   AMB Referral to Pharmacy Medication Management     Endocrine   Thyroid nodule - Primary  Patient referred to endocrinologist, and nephrologist  12/31/2021 1:09 PM EDT  EXAM:  US SOFT TISSUE HEAD AND NECK  INDICATION:  42 years old Female with hyperparathyroidism, ?primary versus secondary to CKD, E21.3 Hyperparathyroidism, unspecified (CMS-HCC).  TECHNIQUE:  Serial grayscale and limited color Doppler ultrasound images of the thyroid gland were obtained in multiple planes.  COMPARISON:  None available.    FINDINGS:  Thyroid: Numerous thyroid nodules, some of which are detailed below. Many of the nodules are predominantly cystic and not reported below. The thyroid is enlarged. Portions of the thyroid show heterogeneous echotexture. Right lobe:  7.8 x 2.8 x 3.7 cm Left lobe:  7.0 x 2.7 x 2.7 cm Isthmus:  1.5 cm  Nodule 1: Img#  [id number] Location:  Mid right lobe posteriorly Size:  1.5 x 1.5 x 1.0 cm Findings:  Mixed cystic and solid hypoechoic nodule, wider than tall, smooth margins, no echogenic foci. TI-RADS level: TR 3  Nodule 2: Img#  [id number]   Location:  Mid right lobe anteriorly Size:  1.8 x 1.7 x 1.5 cm Findings:  Mixed cystic and solid nodule, hypoechoic, wider than tall, smooth margins, no echogenic foci. TI-RADS level:  TR 3  Nodule 3: Img#  [id number] Location:  Mid right lobe posteriorly Size:  1.1 x 1.2 x 1.5 cm Findings:  Solid hypoechoic nodule, follow than wide, smooth margins, peripheral rim calcifications. TI-RADS level:  TR 5  Nodule 4: Img#  [id number] Location:  Upper left lobe Size:  1.2 x 1.0 x 0.8 cm Findings: Mixed cystic and solid hypoechoic nodule, wider than tall, smooth margins, no echogenic foci. TI-RADS level: TR 3  IMPRESSION:    1. Numerous thyroid nodules, the majority of which are predominantly cystic and with a benign appearance. Additional cystic/solid and solid nodules are detailed above. A solid nodule with peripheral rim calcifications in the mid right lobe posteriorly has a TI-RADS score of TR 5, and FNA of this nodule is recommended (nodule 3 in the above report).       Relevant Medications   labetalol (NORMODYNE) 200 MG tablet   Other Relevant Orders   Ambulatory referral to Endocrinology   Hyperparathyroidism [facility])    Patient referred to endocrinology and nephrology  12/31/2021 1:09 PM EDT  EXAM:  US SOFT TISSUE HEAD AND NECK  INDICATION:  42 years old Female with hyperparathyroidism, ?primary versus secondary to CKD, E21.3 Hyperparathyroidism, unspecified (CMS-HCC).  TECHNIQUE:  Serial grayscale and limited color Doppler ultrasound images of the thyroid gland were obtained in multiple planes.  COMPARISON:  None available.    FINDINGS:  Thyroid: Numerous thyroid nodules, some of which are detailed below. Many of the nodules are predominantly cystic and not reported below. The thyroid is enlarged. Portions of the thyroid show heterogeneous echotexture. Right lobe:  7.8 x 2.8 x 3.7 cm Left lobe:  7.0 x 2.7 x 2.7 cm Isthmus:  1.5 cm  Nodule 1: Img#  [id number] Location:  Mid right lobe posteriorly Size:  1.5 x 1.5 x 1.0 cm Findings:  Mixed cystic and solid hypoechoic nodule, wider than tall, smooth margins, no echogenic foci. TI-RADS level: TR 3  Nodule 2: Img#  [id number]   Location:  Mid right lobe anteriorly Size:  1.8 x 1.7 x 1.5 cm Findings:  Mixed cystic and solid nodule, hypoechoic, wider than tall, smooth margins, no echogenic foci. TI-RADS level:  TR 3  Nodule 3: Img#  [id number] Location:  Mid right lobe posteriorly Size:  1.1 x 1.2 x 1.5  cm Findings:  Solid hypoechoic nodule, follow than wide, smooth margins, peripheral rim calcifications. TI-RADS level:  TR 5  Nodule 4: Img#   [id number] Location:  Upper left lobe Size:  1.2 x 1.0 x 0.8 cm Findings: Mixed cystic and solid hypoechoic nodule, wider than tall, smooth margins, no echogenic foci. TI-RADS level: TR 3  IMPRESSION:   1. Numerous thyroid nodules, the majority of which are predominantly cystic and with a benign appearance. Additional cystic/solid and solid nodules are detailed above. A solid nodule with peripheral rim calcifications in the mid right lobe posteriorly has a TI-RADS score of TR 5, and FNA of this nodule is recommended (nodule 3 in the above report).       Relevant Orders   Ambulatory referral to Endocrinology   CMP14+EGFR   PTH, Intact and Calcium     Other   Screening for diabetes mellitus   Relevant Orders   Hemoglobin A1c   Tobacco abuse counseling    Smokes about 0.5 pack/day  Asked about quitting: confirms that he/she currently smokes cigarettes Advise to quit smoking: Educated about QUITTING to reduce the risk of cancer, cardio and cerebrovascular disease. Assess willingness: Unwilling to quit at this time, but is working on cutting back. Assist with counseling and pharmacotherapy: Counseled for 5 minutes and literature provided. Arrange for follow up: follow up in 6 weeks and continue to offer help.       Anxiety and depression       10/18/2022   12:18 PM  GAD 7 : Generalized Anxiety Score  Nervous, Anxious, on Edge 2  Control/stop worrying 3  Worry too much - different things 3  Trouble relaxing 3  Restless 3  Easily annoyed or irritable 3  Afraid - awful might happen 0  Total GAD 7 Score 17  Anxiety Difficulty Very difficult  Currently on Zoloft 50 mg daily Xanax 0.5 mg as needed Start Zoloft 100 mg daily She denies SI, HI Patient referred to psych.       Relevant Medications   sertraline (ZOLOFT) 100 MG tablet   Other Relevant Orders   Ambulatory referral to Psychiatry   Chronic midline low back pain    Flexeril 5 mg 3 times daily as needed  refilled Stretching exercises encouraged      Relevant Medications   sertraline (ZOLOFT) 100 MG tablet   cyclobenzaprine (FLEXERIL) 5 MG tablet    Outpatient Encounter Medications as of 10/18/2022  Medication Sig   ALPRAZolam (XANAX) 0.5 MG tablet TAKE 1/2 TO 1 TABLET UP TO 3 TIMES A DAY AS NEEDED   cloNIDine (CATAPRES) 0.2 MG tablet Take 1 tablet (0.2 mg total) by mouth 2 (two) times daily.   cyclobenzaprine (FLEXERIL) 5 MG tablet Take 1 tablet (5 mg total) by mouth 3 (three) times daily as needed for muscle spasms.   diclofenac sodium (VOLTAREN) 1 % GEL Apply 2 g topically 4 (four) times daily.   senna (SENOKOT) 8.6 MG TABS tablet Take 2 tablets by mouth at bedtime as needed.   sertraline (ZOLOFT) 100 MG tablet Take 1 tablet (100 mg total) by mouth daily.   [DISCONTINUED] cloNIDine (CATAPRES) 0.1 MG tablet Take 1 tablet (0.1 mg total) by mouth 2 (two) times daily.   [DISCONTINUED] cyclobenzaprine (FLEXERIL) 10 MG tablet TAKE 1/2 TO 1 TABLET BY MOUTH THREE TIMES A DAY AS NEEDED   [DISCONTINUED] diltiazem (CARDIZEM CD) 240 MG 24 hr capsule Take 1 capsule (240 mg total) by mouth daily.   [DISCONTINUED] labetalol (  NORMODYNE) 200 MG tablet Take 2 tablets (400 mg total) by mouth 2 (two) times daily.   [DISCONTINUED] sertraline (ZOLOFT) 25 MG tablet Take 25 mg by mouth daily.   diltiazem (CARDIZEM CD) 240 MG 24 hr capsule Take 1 capsule (240 mg total) by mouth daily.   furosemide (LASIX) 20 MG tablet TAKE 1/2 TABLET BY MOUTH ONCE A DAY (Patient not taking: Reported on 10/18/2022)   labetalol (NORMODYNE) 200 MG tablet Take 2 tablets (400 mg total) by mouth 2 (two) times daily.   norethindrone (MICRONOR) 0.35 MG tablet TAKE 1 TABLET BY ORAL ROUTE  EVERY DAY (Patient not taking: Reported on 10/18/2022)   No facility-administered encounter medications on file as of 10/18/2022.    Follow-up: Return in about 6 weeks (around 11/29/2022) for F/u for HTN, depression, anxiety .   Renee Rival, FNP

## 2022-10-18 NOTE — Assessment & Plan Note (Addendum)
BP Readings from Last 3 Encounters:  10/18/22 (!) 164/83  09/13/22 (!) 153/106  05/29/19 (!) 152/102   Chronic uncontrolled condition currently on clonidine 0.1 mg twice daily, Cardizem 240 mg daily, labetalol 400 mg twice daily.  Stated that she has been taking all medications daily as ordered.  She reported some headaches denies chest pain, syncope She was seen nephrology but not currently Start Clonidine 0.2 mg daily continue Cardizem 240 mg daily, labetalol 100 mg twice daily DASH diet advised engage in regular moderate to vigorous exercises at least 250 minutes weekly. Patient referred to nephrology Clinical pharmacist consulted to assist with medication management in the meantime Follow-up in 6 weeks. Medications refilled

## 2022-10-18 NOTE — Assessment & Plan Note (Signed)
Smokes about 0.5 pack/day  Asked about quitting: confirms that he/she currently smokes cigarettes Advise to quit smoking: Educated about QUITTING to reduce the risk of cancer, cardio and cerebrovascular disease. Assess willingness: Unwilling to quit at this time, but is working on cutting back. Assist with counseling and pharmacotherapy: Counseled for 5 minutes and literature provided. Arrange for follow up: follow up in 6 weeks and continue to offer help.

## 2022-10-18 NOTE — Assessment & Plan Note (Signed)
Patient referred to endocrinologist, and nephrologist  12/31/2021 1:09 PM EDT  EXAM:  US SOFT TISSUE HEAD AND NECK  INDICATION:  42 years old Female with hyperparathyroidism, ?primary versus secondary to CKD, E21.3 Hyperparathyroidism, unspecified (CMS-HCC).  TECHNIQUE:  Serial grayscale and limited color Doppler ultrasound images of the thyroid gland were obtained in multiple planes.  COMPARISON:  None available.    FINDINGS:  Thyroid: Numerous thyroid nodules, some of which are detailed below. Many of the nodules are predominantly cystic and not reported below. The thyroid is enlarged. Portions of the thyroid show heterogeneous echotexture. Right lobe:  7.8 x 2.8 x 3.7 cm Left lobe:  7.0 x 2.7 x 2.7 cm Isthmus:  1.5 cm  Nodule 1: Img#  [id number] Location:  Mid right lobe posteriorly Size:  1.5 x 1.5 x 1.0 cm Findings:  Mixed cystic and solid hypoechoic nodule, wider than tall, smooth margins, no echogenic foci. TI-RADS level: TR 3  Nodule 2: Img#  [id number]   Location:  Mid right lobe anteriorly Size:  1.8 x 1.7 x 1.5 cm Findings:  Mixed cystic and solid nodule, hypoechoic, wider than tall, smooth margins, no echogenic foci. TI-RADS level:  TR 3  Nodule 3: Img#  [id number] Location:  Mid right lobe posteriorly Size:  1.1 x 1.2 x 1.5 cm Findings:  Solid hypoechoic nodule, follow than wide, smooth margins, peripheral rim calcifications. TI-RADS level:  TR 5  Nodule 4: Img#  [id number] Location:  Upper left lobe Size:  1.2 x 1.0 x 0.8 cm Findings: Mixed cystic and solid hypoechoic nodule, wider than tall, smooth margins, no echogenic foci. TI-RADS level: TR 3  IMPRESSION:   1. Numerous thyroid nodules, the majority of which are predominantly cystic and with a benign appearance. Additional cystic/solid and solid nodules are detailed above. A solid nodule with peripheral rim calcifications in the mid right lobe posteriorly has a TI-RADS score of TR 5, and FNA of this nodule is  recommended (nodule 3 in the above report).

## 2022-10-19 LAB — CMP14+EGFR
ALT: 15 IU/L (ref 0–32)
AST: 15 IU/L (ref 0–40)
Albumin/Globulin Ratio: 1.3 (ref 1.2–2.2)
Albumin: 3.8 g/dL — ABNORMAL LOW (ref 3.9–4.9)
Alkaline Phosphatase: 118 IU/L (ref 44–121)
BUN/Creatinine Ratio: 13 (ref 9–23)
BUN: 19 mg/dL (ref 6–24)
Bilirubin Total: 0.3 mg/dL (ref 0.0–1.2)
CO2: 19 mmol/L — ABNORMAL LOW (ref 20–29)
Calcium: 10 mg/dL (ref 8.7–10.2)
Chloride: 106 mmol/L (ref 96–106)
Creatinine, Ser: 1.5 mg/dL — ABNORMAL HIGH (ref 0.57–1.00)
Globulin, Total: 3 g/dL (ref 1.5–4.5)
Glucose: 91 mg/dL (ref 70–99)
Potassium: 3.7 mmol/L (ref 3.5–5.2)
Sodium: 139 mmol/L (ref 134–144)
Total Protein: 6.8 g/dL (ref 6.0–8.5)
eGFR: 44 mL/min/{1.73_m2} — ABNORMAL LOW (ref >59)

## 2022-10-19 LAB — HEMOGLOBIN A1C
Est. average glucose Bld gHb Est-mCnc: 105 mg/dL
Hgb A1c MFr Bld: 5.3 % (ref 4.8–5.6)

## 2022-10-19 LAB — PTH, INTACT AND CALCIUM: PTH: 154 pg/mL — ABNORMAL HIGH (ref 15–65)

## 2022-10-20 NOTE — Progress Notes (Signed)
Stable kidney function, please avoid ibuprofen , aleeve, drink at least 64 ounces of water daily to maintain hydration. Follow up with nephrology and endo as dicussed.

## 2022-10-21 ENCOUNTER — Telehealth: Payer: Self-pay

## 2022-10-21 NOTE — Progress Notes (Signed)
Lab results mail it to pt home address.

## 2022-10-21 NOTE — Progress Notes (Signed)
   Care Guide Note  10/21/2022 Name: Dashona Plourde MRN: [id number] DOB: 25-Oct-1980  Referred by: Renee Rival, FNP Reason for referral : Care Coordination (Outreach to schedule with pharm d )   Anniyah Schaff is a 42 y.o. year old female who is a primary care patient of Renee Rival, FNP. Curstin Biddulph was referred to the pharmacist for assistance related to HTN.    An unsuccessful telephone outreach was attempted today to contact the patient who was referred to the pharmacy team for assistance with medication management. Additional attempts will be made to contact the patient.   Noreene Larsson, [place], [place] [address] Direct Dial: [phone] [email or web address]

## 2022-10-28 DIAGNOSIS — Z419 Encounter for procedure for purposes other than remedying health state, unspecified: Secondary | ICD-10-CM | POA: Diagnosis not present

## 2022-10-28 NOTE — Progress Notes (Signed)
   Care Guide Note  10/28/2022 Name: Savannah Martin MRN: [id number] DOB: [birth year]  Referred by: Renee Rival, FNP Reason for referral : Care Coordination (Outreach to schedule with pharm d )   Savannah Martin is a 42 y.o. year old female who is a primary care patient of Renee Rival, FNP. Savannah Martin was referred to the pharmacist for assistance related to HTN.    Successful contact was made with the patient to discuss pharmacy services including being ready for the pharmacist to call at least 5 minutes before the scheduled appointment time, to have medication bottles and any blood sugar or blood pressure readings ready for review. The patient agreed to meet with the pharmacist via with the pharmacist via telephone visit on (date/time).  11/25/2022  Savannah Martin, Savannah Martin, [place] Martin [id number] Direct Dial: [phone] [email or web address]

## 2022-11-03 ENCOUNTER — Institutional Professional Consult (permissible substitution): Payer: Self-pay | Admitting: Clinical

## 2022-11-19 ENCOUNTER — Emergency Department (HOSPITAL_COMMUNITY)
Admission: EM | Admit: 2022-11-19 | Discharge: 2022-11-19 | Disposition: A | Discharge status: Home / Self Care | Payer: Medicaid Other | Attending: Emergency Medicine | Admitting: Emergency Medicine

## 2022-11-19 ENCOUNTER — Other Ambulatory Visit: Payer: Self-pay

## 2022-11-19 ENCOUNTER — Emergency Department (HOSPITAL_COMMUNITY): Payer: Medicaid Other

## 2022-11-19 DIAGNOSIS — I129 Hypertensive chronic kidney disease with stage 1 through stage 4 chronic kidney disease, or unspecified chronic kidney disease: Secondary | ICD-10-CM | POA: Diagnosis not present

## 2022-11-19 DIAGNOSIS — Z91040 Latex allergy status: Secondary | ICD-10-CM | POA: Diagnosis not present

## 2022-11-19 DIAGNOSIS — S8992XA Unspecified injury of left lower leg, initial encounter: Secondary | ICD-10-CM | POA: Insufficient documentation

## 2022-11-19 DIAGNOSIS — Z79899 Other long term (current) drug therapy: Secondary | ICD-10-CM | POA: Diagnosis not present

## 2022-11-19 DIAGNOSIS — N189 Chronic kidney disease, unspecified: Secondary | ICD-10-CM | POA: Insufficient documentation

## 2022-11-19 DIAGNOSIS — W06XXXA Fall from bed, initial encounter: Secondary | ICD-10-CM | POA: Insufficient documentation

## 2022-11-19 NOTE — Discharge Instructions (Signed)
You were seen in the emergency department for knee pain after an injury.  As we discussed your x-ray was normal, without broken or dislocated bones.  I recommend the RICE method -- rest, ice, compression, elevation.  I recommend alternating ibuprofen and Tylenol as needed for pain.

## 2022-11-19 NOTE — ED Provider Notes (Signed)
[facility] Provider Note   CSN: [id number] Arrival date & time: 11/19/22  1317     History  Chief Complaint  Patient presents with   Knee Pain    Savannah Martin is a 42 y.o. female with history of hypertension, anxiety, chronic kidney disease who presents emergency department complaining of left knee pain.  Patient states that she woke up around 5 AM after having a nightmare, and fell out of bed.  She struck the inside of her left knee on an ottoman.  Pain continued throughout the day, and she wanted to check if she had any broken bones or not.  Took some Motrin, that she was prescribed the other day, with minimal relief.   Knee Pain      Home Medications Prior to Admission medications   Medication Sig Start Date End Date Taking? Authorizing Provider  ALPRAZolam (XANAX) 0.5 MG tablet TAKE 1/2 TO 1 TABLET UP TO 3 TIMES A DAY AS NEEDED 03/27/19   [provider]  cloNIDine (CATAPRES) 0.2 MG tablet Take 1 tablet (0.2 mg total) by mouth 2 (two) times daily. 10/18/22   Paseda, Dewaine Conger, FNP  cyclobenzaprine (FLEXERIL) 5 MG tablet Take 1 tablet (5 mg total) by mouth 3 (three) times daily as needed for muscle spasms. 10/18/22   Renee Rival, FNP  diclofenac sodium (VOLTAREN) 1 % GEL Apply 2 g topically 4 (four) times daily. 05/21/19   Vanessa Kick, MD  diltiazem (CARDIZEM CD) 240 MG 24 hr capsule Take 1 capsule (240 mg total) by mouth daily. 10/18/22 01/16/23  Renee Rival, FNP  furosemide (LASIX) 20 MG tablet TAKE 1/2 TABLET BY MOUTH ONCE A DAY Patient not taking: Reported on 10/18/2022 03/27/19   [provider]  labetalol (NORMODYNE) 200 MG tablet Take 2 tablets (400 mg total) by mouth 2 (two) times daily. 10/18/22   Paseda, Dewaine Conger, FNP  norethindrone (MICRONOR) 0.35 MG tablet TAKE 1 TABLET BY ORAL ROUTE  EVERY DAY Patient not taking: Reported on 10/18/2022 03/27/19   [provider]  senna  (SENOKOT) 8.6 MG TABS tablet Take 2 tablets by mouth at bedtime as needed.    [provider]  sertraline (ZOLOFT) 100 MG tablet Take 1 tablet (100 mg total) by mouth daily. 10/18/22   Renee Rival, FNP      Allergies    Latex and Sulfa antibiotics    Review of Systems   Review of Systems  Musculoskeletal:  Positive for arthralgias.  All other systems reviewed and are negative.   Physical Exam Updated Vital Signs BP (!) 178/118 (BP Location: Left Arm)   Pulse 82   Temp 98.4 F (36.9 C) (Oral)   Resp 17   Ht 5\' 2"  (1.575 m)   Wt 88 kg   SpO2 100%   BMI 35.48 kg/m  Physical Exam Vitals and nursing note reviewed.  Constitutional:      Appearance: Normal appearance.  HENT:     Head: Normocephalic and atraumatic.  Eyes:     Conjunctiva/sclera: Conjunctivae normal.  Cardiovascular:     Pulses:          Posterior tibial pulses are 2+ on the right side and 2+ on the left side.  Pulmonary:     Effort: Pulmonary effort is normal. No respiratory distress.  Musculoskeletal:     Comments: Some swelling noted to the medial left knee.  Able to flex and extend the knee normally.  Small  effusion noted.  No bony deformity palpated.  Skin:    General: Skin is warm and dry.  Neurological:     Mental Status: She is alert.     Comments: Sensation intact in bilateral lower extremities  Psychiatric:        Mood and Affect: Mood normal.        Behavior: Behavior normal.     ED Results / Procedures / Treatments   Labs (all labs ordered are listed, but only abnormal results are displayed) Labs Reviewed - No data to display  EKG None  Radiology DG Knee Complete 4 Views Left  Result Date: 11/19/2022 CLINICAL DATA:  Trauma EXAM: LEFT KNEE - COMPLETE 4 VIEW COMPARISON:  None Available. FINDINGS: No fracture, dislocation or subluxation. No osteolytic or osteoblastic changes. There is a moderate suprapatellar effusion. IMPRESSION: Effusion.  No osseous abnormalities.  Electronically Signed   By: Sammie Bench M.D.   On: 11/19/2022 13:55    Procedures Procedures    Medications Ordered in ED Medications - No data to display  ED Course/ Medical Decision Making/ A&P                             Medical Decision Making Amount and/or Complexity of Data Reviewed Radiology: ordered.  This patient is a 42 y.o. female  who presents to the ED for concern of left knee injury.   Differential diagnoses prior to evaluation: The emergent differential diagnosis includes, but is not limited to, extra, dislocation, ligamentous injury. This is not an exhaustive differential.   Past Medical History / Co-morbidities: hypertension, anxiety, chronic kidney disease  Physical Exam: Physical exam performed. The pertinent findings include: Hypertensive, otherwise normal vital signs.  Able to range the joints.  Small effusion, without bony abnormality to palpation.  Neurovascular intact in bilateral lower extremities  Lab Tests/Imaging studies: I personally interpreted labs/imaging and the pertinent results include: X-ray of the left knee shows no osseous abnormalities, moderate effusion.. I agree with the radiologist interpretation.   Disposition: After consideration of the diagnostic results and the patients response to treatment, I feel that emergency department workup does not suggest an emergent condition requiring admission or immediate intervention beyond what has been performed at this time. The plan is: Discharged home with recommendation for RICE method, and over-the-counter medications as needed for left knee injury.  No traumatic findings on x-ray.  Maintains normal range of motion. The patient is safe for discharge and has been instructed to return immediately for worsening symptoms, change in symptoms or any other concerns.  Final Clinical Impression(s) / ED Diagnoses Final diagnoses:  Injury of left knee, initial encounter    Rx / DC Orders ED  Discharge Orders     None      Portions of this report may have been transcribed using voice recognition software. Every effort was made to ensure accuracy; however, inadvertent computerized transcription errors may be present.    Estill Cotta 11/19/22 1434    Charlesetta Shanks, MD 11/21/22 1941

## 2022-11-19 NOTE — ED Triage Notes (Signed)
Pt states she fell out of bed this morning and hit herself on left knee.

## 2022-11-25 ENCOUNTER — Other Ambulatory Visit: Payer: Medicaid Other | Admitting: Pharmacist

## 2022-11-25 ENCOUNTER — Telehealth: Payer: Self-pay | Admitting: Pharmacist

## 2022-11-25 ENCOUNTER — Encounter: Payer: Self-pay | Admitting: Nurse Practitioner

## 2022-11-25 ENCOUNTER — Ambulatory Visit (INDEPENDENT_AMBULATORY_CARE_PROVIDER_SITE_OTHER): Payer: Medicaid Other | Admitting: Nurse Practitioner

## 2022-11-25 VITALS — BP 165/94 | HR 76 | Temp 97.3°F | Ht 62.0 in | Wt 195.6 lb

## 2022-11-25 DIAGNOSIS — F17200 Nicotine dependence, unspecified, uncomplicated: Secondary | ICD-10-CM | POA: Insufficient documentation

## 2022-11-25 DIAGNOSIS — I10 Essential (primary) hypertension: Secondary | ICD-10-CM

## 2022-11-25 DIAGNOSIS — M25562 Pain in left knee: Secondary | ICD-10-CM | POA: Diagnosis not present

## 2022-11-25 DIAGNOSIS — Z720 Tobacco use: Secondary | ICD-10-CM | POA: Diagnosis not present

## 2022-11-25 DIAGNOSIS — R0602 Shortness of breath: Secondary | ICD-10-CM | POA: Insufficient documentation

## 2022-11-25 MED ORDER — ALBUTEROL SULFATE HFA 108 (90 BASE) MCG/ACT IN AERS: 2.0000 | INHALATION_SPRAY | Freq: Four times a day (QID) | RESPIRATORY_TRACT | 0 refills | Status: AC | PRN

## 2022-11-25 MED ORDER — DICLOFENAC SODIUM 1 % EX GEL: 4.0000 g | Freq: Four times a day (QID) | CUTANEOUS | 0 refills | Status: AC

## 2022-11-25 MED ORDER — VARENICLINE TARTRATE (STARTER) 0.5 MG X 11 & 1 MG X 42 PO TBPK: ORAL_TABLET | ORAL | 0 refills | Status: AC

## 2022-11-25 NOTE — Assessment & Plan Note (Addendum)
BP Readings from Last 3 Encounters:  11/25/22 (!) 165/94  11/19/22 (!) 178/118  10/18/22 (!) 164/83    Chronic uncontrolled condition Referral to nephrologist pending She missed her appointment with [facility] pharmacist today she was encouraged to call and reschedule that appointment Continue clonidine 0.2 mg twice daily, Cardizem to 240 mg daily, labetalol 400 mg twice daily  Discussed DASH diet and dietary sodium restrictions Continue to increase dietary efforts and exercise.

## 2022-11-25 NOTE — Assessment & Plan Note (Signed)
Smokes about half pack of cigarettes daily She is interested in smoking cessation  - Varenicline Tartrate, Starter, (CHANTIX STARTING MONTH PAK) 0.5 MG X 11 & 1 MG X 42 TBPK; Take as instructed on packaging  Dispense: 1 each; Refill: 0

## 2022-11-25 NOTE — Assessment & Plan Note (Addendum)
-   Brain natriuretic peptide - albuterol (VENTOLIN HFA) 108 (90 Base) MCG/ACT inhaler; Inhale 2 puffs into the lungs every 6 (six) hours as needed for wheezing or shortness of breath.  Dispense: 8 g; Refill: 0 - DG Chest 2 View; Future Patient referred to pulmonology Smoking cessation encouraged - Varenicline Tartrate, Starter, (CHANTIX STARTING MONTH PAK) 0.5 MG X 11 & 1 MG X 42 TBPK; Take as instructed on packaging  Dispense: 1 each; Refill: 0    addendum Has chronic renal failure, BNP is elevated, checking echocardiogram

## 2022-11-25 NOTE — Patient Instructions (Addendum)
Acute pain of left knee  - Ambulatory referral to Orthopedic Surgery - diclofenac Sodium (VOLTAREN) 1 % GEL; Apply 4 g topically 4 (four) times daily.  Dispense: 50 g; Refill: 0 take please take Tylenol 650 mg every 6 hours as needed.  Hypertension, unspecified type  - Brain natriuretic peptide   SOB (shortness of breath)  - Brain natriuretic peptide - albuterol (VENTOLIN HFA) 108 (90 Base) MCG/ACT inhaler; Inhale 2 puffs into the lungs every 6 (six) hours as needed for wheezing or shortness of breath.  Dispense: 8 g; Refill: 0 - DG Chest 2 View; Future     Tobacco abuse  - Varenicline Tartrate, Starter, (CHANTIX STARTING MONTH PAK) 0.5 MG X 11 & 1 MG X 42 TBPK; Take as instructed on packaging  Dispense: 1 each; Refill: 0     It is important that you exercise regularly at least 30 minutes 5 times a week as tolerated  Think about what you will eat, plan ahead. Choose " clean, green, fresh or frozen" over canned, processed or packaged foods which are more sugary, salty and fatty. 70 to 75% of food eaten should be vegetables and fruit. Three meals at set times with snacks allowed between meals, but they must be fruit or vegetables. Aim to eat over a 12 hour period , example 7 am to 7 pm, and STOP after  your last meal of the day. Drink water,generally about 64 ounces per day, no other drink is as healthy. Fruit juice is best enjoyed in a healthy way, by EATING the fruit.  Thanks for choosing [facility] we consider it a privelige to serve you.

## 2022-11-25 NOTE — Assessment & Plan Note (Signed)
-   Ambulatory referral to Orthopedic Surgery - diclofenac Sodium (VOLTAREN) 1 % GEL; Apply 4 g topically 4 (four) times daily.  Dispense: 50 g; Refill: 0 Take OTC Tylenol as needed for pain

## 2022-11-25 NOTE — Progress Notes (Signed)
Attempted to contact patient for scheduled appointment for medication management. Left HIPAA compliant message for patient to return my call at their convenience.    Catie T. Makhia Vosler, PharmD, BCACP, CPP [facility] [phone]  

## 2022-11-25 NOTE — Progress Notes (Addendum)
Acute Office Visit  Subjective:     Patient ID: Evangline Hutch, female    DOB: [birth year], 42 y.o.   MRN: [id number]  Chief Complaint  Patient presents with   Shortness of Breath    For four months on and off    HPI Ms. Manzueta  has a past medical history of Anxiety, CKD (chronic kidney disease), CKD (chronic kidney disease), stage III, Hyperparathyroidism, Hypertension, LGSIL on Pap smear of cervix (2015), and Proteinuria.   Patient presents with complaints of shortness of breath on exertion,  cough sometimes with sputum, wheezing since the past 4 months.  She smokes 6 cigarettes daily started smoking at age 36.  She denies fever, chills, malaise.  Patient complains of left knee pain and swelling, left leg pain that started after she fell out of bed on 11/19/2022.  Has aching and throbbing pain rated 9/10.  She has been taking as needed without relief.      Review of Systems  Constitutional:  Negative for chills, diaphoresis, fever, malaise/fatigue and weight loss.  Respiratory:  Positive for cough, sputum production, shortness of breath and wheezing. Negative for hemoptysis.   Cardiovascular:  Negative for chest pain, palpitations, orthopnea and claudication.  Gastrointestinal: Negative.   Musculoskeletal:  Positive for falls and joint pain.  Neurological: Negative.   Psychiatric/Behavioral: Negative.  Negative for depression, hallucinations, substance abuse and suicidal ideas.         Objective:    BP (!) 165/94   Pulse 76   Temp (!) 97.3 F (36.3 C)   Ht 5\' 2"  (1.575 m)   Wt 195 lb 9.6 oz (88.7 kg)   SpO2 100%   BMI 35.78 kg/m    Physical Exam Constitutional:      General: She is not in acute distress.    Appearance: She is not ill-appearing, toxic-appearing or diaphoretic.  Eyes:     General: No scleral icterus.       Right eye: No discharge.        Left eye: No discharge.  Cardiovascular:     Rate and Rhythm: Normal rate and regular rhythm.      Pulses: Normal pulses.     Heart sounds: Normal heart sounds. No murmur heard.    No friction rub. No gallop.  Pulmonary:     Effort: Pulmonary effort is normal. No respiratory distress.     Breath sounds: Normal breath sounds. No stridor. No wheezing, rhonchi or rales.  Chest:     Chest wall: No tenderness.  Abdominal:     General: There is no distension.     Palpations: Abdomen is soft.     Tenderness: There is no abdominal tenderness. There is no guarding.  Musculoskeletal:        General: Swelling, tenderness and signs of injury present.     Left lower leg: Edema present.     Comments: Left leg swelling , bruised , tender on palpation  Skin:    General: Skin is warm and dry.     Capillary Refill: Capillary refill takes less than 2 seconds.     Findings: Bruising present.  Neurological:     Mental Status: She is alert and oriented to person, place, and time.     Cranial Nerves: No cranial nerve deficit.     Motor: No weakness.     Coordination: Coordination normal.     Results for orders placed or performed in visit on 11/25/22  Brain natriuretic peptide  Result Value  Ref Range   BNP 1,101.9 (H) 0.0 - 100.0 pg/mL        Assessment & Plan:   Problem List Items Addressed This Visit       Cardiovascular and Mediastinum   High blood pressure    BP Readings from Last 3 Encounters:  11/25/22 (!) 165/94  11/19/22 (!) 178/118  10/18/22 (!) 164/83    Chronic uncontrolled condition Referral to nephrologist pending She missed her appointment with [facility] pharmacist today she was encouraged to call and reschedule that appointment Continue clonidine 0.2 mg twice daily, Cardizem to 240 mg daily, labetalol 400 mg twice daily  Discussed DASH diet and dietary sodium restrictions Continue to increase dietary efforts and exercise.         Relevant Orders   Brain natriuretic peptide (Completed)     Other   Left knee pain     - Ambulatory referral to Orthopedic Surgery -  diclofenac Sodium (VOLTAREN) 1 % GEL; Apply 4 g topically 4 (four) times daily.  Dispense: 50 g; Refill: 0 Take OTC Tylenol as needed for pain      Relevant Medications   diclofenac Sodium (VOLTAREN) 1 % GEL   Other Relevant Orders   Ambulatory referral to Orthopedic Surgery   Tobacco abuse    Smokes about half pack of cigarettes daily She is interested in smoking cessation  - Varenicline Tartrate, Starter, (CHANTIX STARTING MONTH PAK) 0.5 MG X 11 & 1 MG X 42 TBPK; Take as instructed on packaging  Dispense: 1 each; Refill: 0       Relevant Medications   Varenicline Tartrate, Starter, (CHANTIX STARTING MONTH PAK) 0.5 MG X 11 & 1 MG X 42 TBPK   SOB (shortness of breath) - Primary     - Brain natriuretic peptide - albuterol (VENTOLIN HFA) 108 (90 Base) MCG/ACT inhaler; Inhale 2 puffs into the lungs every 6 (six) hours as needed for wheezing or shortness of breath.  Dispense: 8 g; Refill: 0 - DG Chest 2 View; Future Patient referred to pulmonology Smoking cessation encouraged - Varenicline Tartrate, Starter, (CHANTIX STARTING MONTH PAK) 0.5 MG X 11 & 1 MG X 42 TBPK; Take as instructed on packaging  Dispense: 1 each; Refill: 0    addendum Has chronic renal failure, BNP is elevated, checking echocardiogram      Relevant Medications   albuterol (VENTOLIN HFA) 108 (90 Base) MCG/ACT inhaler   Other Relevant Orders   Brain natriuretic peptide (Completed)   DG Chest 2 View   Ambulatory referral to Pulmonology    Meds ordered this encounter  Medications   albuterol (VENTOLIN HFA) 108 (90 Base) MCG/ACT inhaler    Sig: Inhale 2 puffs into the lungs every 6 (six) hours as needed for wheezing or shortness of breath.    Dispense:  8 g    Refill:  0   Varenicline Tartrate, Starter, (CHANTIX STARTING MONTH PAK) 0.5 MG X 11 & 1 MG X 42 TBPK    Sig: Take as instructed on packaging    Dispense:  1 each    Refill:  0   diclofenac Sodium (VOLTAREN) 1 % GEL    Sig: Apply 4 g topically 4  (four) times daily.    Dispense:  50 g    Refill:  0    No follow-ups on file.  Donell Beers, FNP

## 2022-11-26 LAB — BRAIN NATRIURETIC PEPTIDE: BNP: 1101.9 pg/mL — ABNORMAL HIGH (ref 0.0–100.0)

## 2022-11-28 DIAGNOSIS — Z419 Encounter for procedure for purposes other than remedying health state, unspecified: Secondary | ICD-10-CM | POA: Diagnosis not present

## 2022-11-30 ENCOUNTER — Other Ambulatory Visit: Payer: Self-pay | Admitting: Nurse Practitioner

## 2022-11-30 DIAGNOSIS — I10 Essential (primary) hypertension: Secondary | ICD-10-CM

## 2022-11-30 MED ORDER — POTASSIUM CHLORIDE CRYS ER 10 MEQ PO TBCR
10.0000 meq | EXTENDED_RELEASE_TABLET | Freq: Every day | ORAL | 0 refills | Status: DC
Start: 2022-11-30 — End: 2023-01-24

## 2022-11-30 MED ORDER — FUROSEMIDE 20 MG PO TABS
20.0000 mg | ORAL_TABLET | Freq: Every day | ORAL | 0 refills | Status: DC
Start: 2022-11-30 — End: 2023-01-24

## 2022-11-30 NOTE — Progress Notes (Signed)
Patient has evidence of heart failure based on her labs and symptoms and her uncontrolled hypertension.  Please start taking furosemide 20 mg daily take with potassium 10 mill equivalents daily.  Continue other hypertensive medications.  Please call Katie to reschedule her missed appointment. We need to get her hypertension under control.  I have also ordered echocardiogram to be done.  Patient should follow-up with me  in the office in 1 week, currently scheduled his appointment.  Thank you

## 2022-12-05 ENCOUNTER — Encounter: Payer: Self-pay | Admitting: "Endocrinology

## 2022-12-07 ENCOUNTER — Other Ambulatory Visit: Payer: Self-pay | Admitting: Nurse Practitioner

## 2022-12-07 ENCOUNTER — Encounter: Payer: Self-pay | Admitting: Physician Assistant

## 2022-12-07 ENCOUNTER — Telehealth: Payer: Self-pay

## 2022-12-07 ENCOUNTER — Ambulatory Visit (INDEPENDENT_AMBULATORY_CARE_PROVIDER_SITE_OTHER): Payer: Medicaid Other | Admitting: Physician Assistant

## 2022-12-07 ENCOUNTER — Telehealth: Payer: Self-pay | Admitting: Nurse Practitioner

## 2022-12-07 ENCOUNTER — Other Ambulatory Visit (INDEPENDENT_AMBULATORY_CARE_PROVIDER_SITE_OTHER): Payer: Medicaid Other

## 2022-12-07 DIAGNOSIS — G8929 Other chronic pain: Secondary | ICD-10-CM

## 2022-12-07 DIAGNOSIS — M79605 Pain in left leg: Secondary | ICD-10-CM

## 2022-12-07 MED ORDER — HYDROCODONE-ACETAMINOPHEN 5-325 MG PO TABS: 1.0000 | ORAL_TABLET | ORAL | 0 refills | Status: AC | PRN

## 2022-12-07 NOTE — Progress Notes (Signed)
Office Visit Note   Patient: Savannah Martin           Date of Birth: 07-04-1981           MRN: [id number] Visit Date: 12/07/2022              Requested by: Donell Beers, FNP [address] [place],,  [place] [address] PCP: Donell Beers, FNP   Assessment & Plan: Visit Diagnoses:  1. Pain in left leg     Plan: Patient is a pleasant 42 year old woman who is 3 weeks status post falling from bed and hitting the front of her lower leg on the left on a wooden ottoman.  She was seen and evaluated in the emergency room where x-rays of her ankle and knee were negative.  She comes in today for follow-up.  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  Her pain is not in her knee but over the front of her tibia in the area of the bruising.  Denies any calf pain.  She does have quite a bit of swelling and bruising but her compartments soft nontender she has a negative Homans' sign.  No pain in the calf of the leg she is neurovascularly intact with good flexion and extension of her ankle.  She where she is tender is anteriorly over the bruising on the anterior tibia.  Her knee exam is benign.  We talked about unfortunately she cannot take time off work and she is on her feet all day.  Would like for her to get some compression hose.  Unfortunately she cannot take anti-inflammatories because of chronic kidney disease I will give her a few hydrocodone which she knows only to take at night.  She needs to ice and elevate her leg is much as possible and could alternate heat and ice considering her length of time from injury.  She will follow-up with me in 2 weeks but is to call me if she gets worse  Follow-Up Instructions: Return in about 2 weeks (around 12/21/2022).   Orders:  Orders Placed This Encounter  Procedures   XR Tibia/Fibula Left   Meds ordered this encounter  Medications   HYDROcodone-acetaminophen (NORCO/VICODIN) 5-325 MG tablet    Sig: Take 1  tablet by mouth every 4 (four) hours as needed for moderate pain.    Dispense:  20 tablet    Refill:  0      Procedures: No procedures performed   Clinical Data: No additional findings.   Subjective: Chief Complaint  Patient presents with   Left Knee - Pain    HPI [name] is a pleasant 42 year old woman who is 3 weeks status post falling off a bed on to the front of a wooden ottoman making contact with the ottoman with the front of her leg.  She describes the pain is moderate plus.  She was seen in the emergency room did not have any osseous injuries.  While her knee does not bother her on the left is her anterior part of her leg that really bothers her she denies any calf pain fever or chills  Review of Systems  All other systems reviewed and are negative.    Objective: Vital Signs: There were no vitals taken for this visit.  Physical Exam Constitutional:      Appearance: Normal appearance.  Pulmonary:     Effort: Pulmonary effort is normal.  Skin:    General:  Skin is warm and dry.  Neurological:     General: No focal deficit present.     Mental Status: She is alert.     Ortho Exam Examination of her left leg she does have moderate soft tissue swelling from her knee down into her ankle.  She has good active dorsiflexion and plantarflexion of her ankle.  Pulses are intact sensation is intact compartments of her calf are soft and compressible she has a negative Homans' sign.  In her ankle she has full active range of motion though mildly stiff because of some of the swelling.  She has ecchymotic areas over the anterior tibia that are painful to light touch.  No tenderness over the fibular head Specialty Comments:  No specialty comments available.  Imaging: XR Tibia/Fibula Left  Result Date: 12/07/2022 Three-view radiographs of her left tib-fib demonstrate well-maintained alignment no evidence of any fracture    PMFS History: Patient Active Problem List    Diagnosis Date Noted   Left knee pain 11/25/2022   Tobacco abuse 11/25/2022   SOB (shortness of breath) 11/25/2022   Thyroid nodule 10/18/2022   High blood pressure 10/18/2022   Hyperparathyroidism 10/18/2022   Screening for diabetes mellitus 10/18/2022   Tobacco abuse counseling 10/18/2022   Anxiety and depression 10/18/2022   Chronic midline low back pain 10/18/2022   Past Medical History:  Diagnosis Date   Anxiety    CKD (chronic kidney disease)    CKD (chronic kidney disease), stage III    Hyperparathyroidism    Hypertension    LGSIL on Pap smear of cervix 2015   Proteinuria     Family History  Problem Relation Age of Onset   Hypertension Mother    Stroke Mother 71   Anuerysm Mother    Hypertension Father    Epilepsy Sister    Bipolar disorder Sister    Cancer - Cervical Sister    Stroke Maternal Grandmother    Breast cancer Neg Hx    Lung cancer Neg Hx     History reviewed. No pertinent surgical history. Social History   Occupational History   Not on file  Tobacco Use   Smoking status: Every Day    Packs/day: .5    Types: Cigarettes   Smokeless tobacco: Never   Tobacco comments:    Started smoking cigarettes at age 26, smokes 0.5 [pack daily.   Vaping Use   Vaping Use: Never used  Substance and Sexual Activity   Alcohol use: Yes    Comment: occasionally   Drug use: Yes    Types: Marijuana    Comment: occasionally   Sexual activity: Yes

## 2022-12-07 NOTE — Telephone Encounter (Signed)
Please advise kH 

## 2022-12-07 NOTE — Progress Notes (Signed)
   Care Guide Note  12/07/2022 Name: Savannah Martin MRN: [id number] DOB: Aug 28, 1981  Referred by: Donell Beers, FNP Reason for referral : Care Coordination (Outreach to reschedule missed initial with Pharm d )   Savannah Martin is a 42 y.o. year old female who is a primary care patient of Donell Beers, FNP. Savannah Martin was referred to the pharmacist for assistance related to DM.    An unsuccessful telephone outreach was attempted today to contact the patient who was referred to the pharmacy team for assistance with medication management. Additional attempts will be made to contact the patient.   Savannah Martin, RMA Care Guide [facility]  [place], [place] [address] Direct Dial: [phone] [email or web address]

## 2022-12-07 NOTE — Telephone Encounter (Signed)
Started prior auth for Echo today 4/10 It is still pending, had to fax over office notes to [phone]  Tracking # for case: [id number]  Waiting on response from insurance

## 2022-12-13 NOTE — Progress Notes (Signed)
   Care Guide Note  12/13/2022 Name: Anela Bensman MRN: [id number] DOB: 1980/09/05  Referred by: Donell Beers, FNP Reason for referral : Care Coordination (Outreach to reschedule missed initial with Pharm d )   Jenavive Landry is a 42 y.o. year old female who is a primary care patient of Donell Beers, FNP. Dicie Sawatzky was referred to the pharmacist for assistance related to DM.    A second unsuccessful telephone outreach was attempted today to contact the patient who was referred to the pharmacy team for assistance with medication management. Additional attempts will be made to contact the patient.  Penne Lash, RMA Care Guide [facility]  [place], [place] [address] Direct Dial: [phone] [email or web address]

## 2022-12-19 NOTE — Progress Notes (Signed)
   Care Guide Note  12/19/2022 Name: Esraa Seres MRN: [id number] DOB: 22-Nov-1980  Referred by: Donell Beers, FNP Reason for referral : Care Coordination (Outreach to reschedule missed initial with Pharm d )   Leya Jelinek is a 42 y.o. year old female who is a primary care patient of Donell Beers, FNP. Ermalee Oppedisano was referred to the pharmacist for assistance related to DM.    A third unsuccessful telephone outreach was attempted today to contact the patient who was referred to the pharmacy team for assistance with medication management. The Population Health team is pleased to engage with this patient at any time in the future upon receipt of referral and should he/she be interested in assistance from the [facility] team.   Penne Lash, RMA Care Guide [facility]  [place], [place] [address] Direct Dial: [phone] [email or web address]

## 2022-12-20 NOTE — Progress Notes (Deleted)
Synopsis: Referred for dyspnea by Donell Beers, FNP  Subjective:   PATIENT ID: Savannah Martin GENDER: female DOB: 11-19-1980, MRN: [id number]  No chief complaint on file.  42yF with history of CKD, HTN, hyperparathyroid, proteinuria, smoking who is referred for dyspnea    Otherwise pertinent review of systems is negative.  Past Medical History:  Diagnosis Date   Anxiety    CKD (chronic kidney disease)    CKD (chronic kidney disease), stage III    Hyperparathyroidism    Hypertension    LGSIL on Pap smear of cervix 2015   Proteinuria      Family History  Problem Relation Age of Onset   Hypertension Mother    Stroke Mother 54   Anuerysm Mother    Hypertension Father    Epilepsy Sister    Bipolar disorder Sister    Cancer - Cervical Sister    Stroke Maternal Grandmother    Breast cancer Neg Hx    Lung cancer Neg Hx      No past surgical history on file.  Social History   Socioeconomic History   Marital status: Single    Spouse name: Not on file   Number of children: 4   Years of education: Not on file   Highest education level: Not on file  Occupational History   Not on file  Tobacco Use   Smoking status: Every Day    Packs/day: .5    Types: Cigarettes   Smokeless tobacco: Never   Tobacco comments:    Started smoking cigarettes at age 42, smokes 0.5 [pack daily.   Vaping Use   Vaping Use: Never used  Substance and Sexual Activity   Alcohol use: Yes    Comment: occasionally   Drug use: Yes    Types: Marijuana    Comment: occasionally   Sexual activity: Yes  Other Topics Concern   Not on file  Social History Narrative   Lives with her children    Social Determinants of Health   Financial Resource Strain: Not on file  Food Insecurity: Not on file  Transportation Needs: Not on file  Physical Activity: Not on file  Stress: Not on file  Social Connections: Not on file  Intimate Partner Violence: Not on file     Allergies  Allergen  Reactions   Latex Rash   Sulfa Antibiotics Rash     Outpatient Medications Prior to Visit  Medication Sig Dispense Refill   albuterol (VENTOLIN HFA) 108 (90 Base) MCG/ACT inhaler Inhale 2 puffs into the lungs every 6 (six) hours as needed for wheezing or shortness of breath. 8 g 0   ALPRAZolam (XANAX) 0.5 MG tablet TAKE 1/2 TO 1 TABLET UP TO 3 TIMES A DAY AS NEEDED     cloNIDine (CATAPRES) 0.2 MG tablet Take 1 tablet (0.2 mg total) by mouth 2 (two) times daily. 180 tablet 0   cyclobenzaprine (FLEXERIL) 5 MG tablet TAKE 1 TABLET (5 MG TOTAL) BY MOUTH 3 (THREE) TIMES DAILY AS NEEDED FOR MUSCLE SPASMS. 30 tablet 1   diclofenac Sodium (VOLTAREN) 1 % GEL Apply 4 g topically 4 (four) times daily. 50 g 0   diltiazem (CARDIZEM CD) 240 MG 24 hr capsule Take 1 capsule (240 mg total) by mouth daily. 90 capsule 0   furosemide (LASIX) 20 MG tablet Take 1 tablet (20 mg total) by mouth daily. 60 tablet 0   HYDROcodone-acetaminophen (NORCO/VICODIN) 5-325 MG tablet Take 1 tablet by mouth every 4 (four) hours as  needed for moderate pain. 20 tablet 0   labetalol (NORMODYNE) 200 MG tablet Take 2 tablets (400 mg total) by mouth 2 (two) times daily. 240 tablet 1   norethindrone (MICRONOR) 0.35 MG tablet TAKE 1 TABLET BY ORAL ROUTE  EVERY DAY (Patient not taking: Reported on 10/18/2022)     potassium chloride (KLOR-CON M) 10 MEQ tablet Take 1 tablet (10 mEq total) by mouth daily. 60 tablet 0   senna (SENOKOT) 8.6 MG TABS tablet Take 2 tablets by mouth at bedtime as needed.     sertraline (ZOLOFT) 100 MG tablet Take 1 tablet (100 mg total) by mouth daily. 30 tablet 3   Varenicline Tartrate, Starter, (CHANTIX STARTING MONTH PAK) 0.5 MG X 11 & 1 MG X 42 TBPK Take as instructed on packaging 1 each 0   No facility-administered medications prior to visit.       Objective:   Physical Exam:  General appearance: 42 y.o., female, NAD, conversant  Eyes: anicteric sclerae; PERRL, tracking appropriately HENT: NCAT;  MMM Neck: Trachea midline; no lymphadenopathy, no JVD Lungs: CTAB, no crackles, no wheeze, with normal respiratory effort CV: RRR, no murmur  Abdomen: Soft, non-tender; non-distended, BS present  Extremities: No peripheral edema, warm Skin: Normal turgor and texture; no rash Psych: Appropriate affect Neuro: Alert and oriented to person and place, no focal deficit     There were no vitals filed for this visit.   on *** LPM *** RA BMI Readings from Last 3 Encounters:  11/25/22 35.78 kg/m  11/19/22 35.48 kg/m  10/18/22 34.86 kg/m   Wt Readings from Last 3 Encounters:  11/25/22 195 lb 9.6 oz (88.7 kg)  11/19/22 194 lb (88 kg)  10/18/22 196 lb 12.8 oz (89.3 kg)     CBC No results found for: "WBC", "RBC", "HGB", "HCT", "PLT", "MCV", "MCH", "MCHC", "RDW", "LYMPHSABS", "MONOABS", "EOSABS", "BASOSABS"  ***  Chest Imaging: ***  Pulmonary Functions Testing Results:     No data to display          FeNO: ***  Pathology: ***  Echocardiogram: ***  Heart Catheterization: ***    Assessment & Plan:    Plan:      Omar Person, MD [facility] 12/20/2022 4:07 PM

## 2022-12-21 ENCOUNTER — Other Ambulatory Visit (HOSPITAL_COMMUNITY): Payer: Medicaid Other

## 2022-12-21 ENCOUNTER — Ambulatory Visit: Payer: Medicaid Other | Admitting: Nurse Practitioner

## 2022-12-21 ENCOUNTER — Telehealth: Payer: Self-pay

## 2022-12-21 NOTE — Progress Notes (Signed)
   Care Guide Note  12/21/2022 Name: [name] MRN: [id number] DOB: [birth year]  Referred by: Donell Beers, FNP Reason for referral : Care Coordination (Outreach to rs with Pharm d )   Savannah Martin is a 42 y.o. year old female who is a primary care patient of Donell Beers, FNP. Artemis Keeling was referred to the pharmacist for assistance related to DM.    An unsuccessful telephone outreach was attempted today to contact the patient who was referred to the pharmacy team for assistance with medication management. Additional attempts will be made to contact the patient.   Penne Lash, RMA Care Guide [facility]  [place], [place] [address] Direct Dial: [phone] [email or web address]

## 2022-12-22 ENCOUNTER — Ambulatory Visit: Payer: Medicaid Other | Admitting: Physician Assistant

## 2022-12-22 ENCOUNTER — Institutional Professional Consult (permissible substitution): Payer: Medicaid Other | Admitting: Student

## 2022-12-28 DIAGNOSIS — Z419 Encounter for procedure for purposes other than remedying health state, unspecified: Secondary | ICD-10-CM | POA: Diagnosis not present

## 2022-12-29 ENCOUNTER — Ambulatory Visit (HOSPITAL_COMMUNITY): Payer: Self-pay | Admitting: Student in an Organized Health Care Education/Training Program

## 2023-01-03 ENCOUNTER — Ambulatory Visit (HOSPITAL_COMMUNITY)
Admission: RE | Admit: 2023-01-03 | Discharge: 2023-01-03 | Disposition: A | Discharge status: Home / Self Care | Payer: Medicaid Other | Source: Ambulatory Visit | Attending: Nurse Practitioner | Admitting: Nurse Practitioner

## 2023-01-03 DIAGNOSIS — F17200 Nicotine dependence, unspecified, uncomplicated: Secondary | ICD-10-CM | POA: Insufficient documentation

## 2023-01-03 DIAGNOSIS — I110 Hypertensive heart disease with heart failure: Secondary | ICD-10-CM | POA: Insufficient documentation

## 2023-01-03 DIAGNOSIS — R0602 Shortness of breath: Secondary | ICD-10-CM | POA: Insufficient documentation

## 2023-01-03 DIAGNOSIS — I10 Essential (primary) hypertension: Secondary | ICD-10-CM | POA: Diagnosis not present

## 2023-01-03 DIAGNOSIS — I509 Heart failure, unspecified: Secondary | ICD-10-CM | POA: Diagnosis not present

## 2023-01-03 LAB — ECHOCARDIOGRAM COMPLETE
AR max vel: 2.4 cm2
AV Area VTI: 2.52 cm2
AV Area mean vel: 2.35 cm2
AV Mean grad: 4 mmHg
AV Peak grad: 8.9 mmHg
Ao pk vel: 1.49 m/s
Area-P 1/2: 4.57 cm2
Calc EF: 59.2 %
S' Lateral: 2.3 cm
Single Plane A2C EF: 52.4 %
Single Plane A4C EF: 66.5 %

## 2023-01-04 ENCOUNTER — Other Ambulatory Visit: Payer: Self-pay | Admitting: Nurse Practitioner

## 2023-01-04 DIAGNOSIS — R7989 Other specified abnormal findings of blood chemistry: Secondary | ICD-10-CM

## 2023-01-04 DIAGNOSIS — I10 Essential (primary) hypertension: Secondary | ICD-10-CM

## 2023-01-04 NOTE — Progress Notes (Signed)
Called pt and left a message for call back.   

## 2023-01-04 NOTE — Progress Notes (Signed)
I have put in a referral to cardiology.  Her referral to nephrology has been approved.  Please notify the patient so she can schedule an appointment with them if she has not done so already.  Thank you

## 2023-01-05 NOTE — Progress Notes (Signed)
Pt is schedule with cardiology and nephrology. Gh

## 2023-01-05 NOTE — Progress Notes (Signed)
Per pt already schedule for this. Gh

## 2023-01-05 NOTE — Progress Notes (Deleted)
Synopsis: Referred for dyspnea by Donell Beers, FNP  Subjective:   PATIENT ID: Savannah Martin GENDER: female DOB: March 21, 1981, MRN: [id number]  No chief complaint on file.  42yF with history of CKD, HTN, hyperparathyroid, proteinuria, smoking who is referred for dyspnea    Otherwise pertinent review of systems is negative.  Past Medical History:  Diagnosis Date   Anxiety    CKD (chronic kidney disease)    CKD (chronic kidney disease), stage III (HCC)    Hyperparathyroidism (HCC)    Hypertension    LGSIL on Pap smear of cervix 2015   Proteinuria      Family History  Problem Relation Age of Onset   Hypertension Mother    Stroke Mother 53   Anuerysm Mother    Hypertension Father    Epilepsy Sister    Bipolar disorder Sister    Cancer - Cervical Sister    Stroke Maternal Grandmother    Breast cancer Neg Hx    Lung cancer Neg Hx      No past surgical history on file.  Social History   Socioeconomic History   Marital status: Single    Spouse name: Not on file   Number of children: 4   Years of education: Not on file   Highest education level: Not on file  Occupational History   Not on file  Tobacco Use   Smoking status: Every Day    Packs/day: .5    Types: Cigarettes   Smokeless tobacco: Never   Tobacco comments:    Started smoking cigarettes at age 31, smokes 0.5 [pack daily.   Vaping Use   Vaping Use: Never used  Substance and Sexual Activity   Alcohol use: Yes    Comment: occasionally   Drug use: Yes    Types: Marijuana    Comment: occasionally   Sexual activity: Yes  Other Topics Concern   Not on file  Social History Narrative   Lives with her children    Social Determinants of Health   Financial Resource Strain: Not on file  Food Insecurity: Not on file  Transportation Needs: Not on file  Physical Activity: Not on file  Stress: Not on file  Social Connections: Not on file  Intimate Partner Violence: Not on file     Allergies   Allergen Reactions   Latex Rash   Sulfa Antibiotics Rash     Outpatient Medications Prior to Visit  Medication Sig Dispense Refill   albuterol (VENTOLIN HFA) 108 (90 Base) MCG/ACT inhaler Inhale 2 puffs into the lungs every 6 (six) hours as needed for wheezing or shortness of breath. 8 g 0   ALPRAZolam (XANAX) 0.5 MG tablet TAKE 1/2 TO 1 TABLET UP TO 3 TIMES A DAY AS NEEDED     cloNIDine (CATAPRES) 0.2 MG tablet Take 1 tablet (0.2 mg total) by mouth 2 (two) times daily. 180 tablet 0   cyclobenzaprine (FLEXERIL) 5 MG tablet TAKE 1 TABLET (5 MG TOTAL) BY MOUTH 3 (THREE) TIMES DAILY AS NEEDED FOR MUSCLE SPASMS. 30 tablet 1   diclofenac Sodium (VOLTAREN) 1 % GEL Apply 4 g topically 4 (four) times daily. 50 g 0   diltiazem (CARDIZEM CD) 240 MG 24 hr capsule Take 1 capsule (240 mg total) by mouth daily. 90 capsule 0   furosemide (LASIX) 20 MG tablet Take 1 tablet (20 mg total) by mouth daily. 60 tablet 0   HYDROcodone-acetaminophen (NORCO/VICODIN) 5-325 MG tablet Take 1 tablet by mouth every 4 (four)  hours as needed for moderate pain. 20 tablet 0   labetalol (NORMODYNE) 200 MG tablet Take 2 tablets (400 mg total) by mouth 2 (two) times daily. 240 tablet 1   norethindrone (MICRONOR) 0.35 MG tablet TAKE 1 TABLET BY ORAL ROUTE  EVERY DAY (Patient not taking: Reported on 10/18/2022)     potassium chloride (KLOR-CON M) 10 MEQ tablet Take 1 tablet (10 mEq total) by mouth daily. 60 tablet 0   senna (SENOKOT) 8.6 MG TABS tablet Take 2 tablets by mouth at bedtime as needed.     sertraline (ZOLOFT) 100 MG tablet Take 1 tablet (100 mg total) by mouth daily. 30 tablet 3   Varenicline Tartrate, Starter, (CHANTIX STARTING MONTH PAK) 0.5 MG X 11 & 1 MG X 42 TBPK Take as instructed on packaging 1 each 0   No facility-administered medications prior to visit.       Objective:   Physical Exam:  General appearance: 42 y.o., female, NAD, conversant  Eyes: anicteric sclerae; PERRL, tracking  appropriately HENT: NCAT; MMM Neck: Trachea midline; no lymphadenopathy, no JVD Lungs: CTAB, no crackles, no wheeze, with normal respiratory effort CV: RRR, no murmur  Abdomen: Soft, non-tender; non-distended, BS present  Extremities: No peripheral edema, warm Skin: Normal turgor and texture; no rash Psych: Appropriate affect Neuro: Alert and oriented to person and place, no focal deficit     There were no vitals filed for this visit.   on *** LPM *** RA BMI Readings from Last 3 Encounters:  11/25/22 35.78 kg/m  11/19/22 35.48 kg/m  10/18/22 34.86 kg/m   Wt Readings from Last 3 Encounters:  11/25/22 195 lb 9.6 oz (88.7 kg)  11/19/22 194 lb (88 kg)  10/18/22 196 lb 12.8 oz (89.3 kg)     CBC No results found for: "WBC", "RBC", "HGB", "HCT", "PLT", "MCV", "MCH", "MCHC", "RDW", "LYMPHSABS", "MONOABS", "EOSABS", "BASOSABS"  ***  Chest Imaging: ***  Pulmonary Functions Testing Results:     No data to display          FeNO: ***  Pathology: ***  Echocardiogram 01/03/23:    1. Left ventricular ejection fraction, by estimation, is 60 to 65%. The  left ventricle has normal function. The left ventricle has no regional  wall motion abnormalities. There is severe concentric left ventricular  hypertrophy. Left ventricular diastolic   parameters are consistent with Grade II diastolic dysfunction  (pseudonormalization).   2. Right ventricular systolic function is normal. The right ventricular  size is normal. There is mildly elevated pulmonary artery systolic  pressure. The estimated right ventricular systolic pressure is 46.6 mmHg.   3. Left atrial size was severely dilated.   4. The mitral valve is grossly normal. Mild mitral valve regurgitation.  No evidence of mitral stenosis.   5. The aortic valve is tricuspid. Aortic valve regurgitation is not  visualized. No aortic stenosis is present.   6. The inferior vena cava is normal in size with greater than 50%   respiratory variability, suggesting right atrial pressure of 3 mmHg.       Assessment & Plan:    Plan:      Omar Person, MD [facility] 01/05/2023 10:26 AM

## 2023-01-09 ENCOUNTER — Institutional Professional Consult (permissible substitution): Payer: Medicaid Other | Admitting: Student

## 2023-01-09 DIAGNOSIS — N1832 Chronic kidney disease, stage 3b: Secondary | ICD-10-CM | POA: Diagnosis not present

## 2023-01-09 DIAGNOSIS — E876 Hypokalemia: Secondary | ICD-10-CM | POA: Diagnosis not present

## 2023-01-09 DIAGNOSIS — I129 Hypertensive chronic kidney disease with stage 1 through stage 4 chronic kidney disease, or unspecified chronic kidney disease: Secondary | ICD-10-CM | POA: Diagnosis not present

## 2023-01-18 ENCOUNTER — Encounter: Payer: Self-pay | Admitting: "Endocrinology

## 2023-01-18 ENCOUNTER — Ambulatory Visit (INDEPENDENT_AMBULATORY_CARE_PROVIDER_SITE_OTHER): Payer: Medicaid Other | Admitting: "Endocrinology

## 2023-01-18 VITALS — BP 170/118 | HR 76 | Ht 62.0 in | Wt 185.0 lb

## 2023-01-18 DIAGNOSIS — E042 Nontoxic multinodular goiter: Secondary | ICD-10-CM | POA: Diagnosis not present

## 2023-01-18 DIAGNOSIS — F17200 Nicotine dependence, unspecified, uncomplicated: Secondary | ICD-10-CM

## 2023-01-18 DIAGNOSIS — E212 Other hyperparathyroidism: Secondary | ICD-10-CM | POA: Diagnosis not present

## 2023-01-18 DIAGNOSIS — I10 Essential (primary) hypertension: Secondary | ICD-10-CM | POA: Diagnosis not present

## 2023-01-18 MED ORDER — DILTIAZEM HCL ER COATED BEADS 240 MG PO CP24
240.0000 mg | ORAL_CAPSULE | Freq: Every day | ORAL | 1 refills | Status: DC
Start: 2023-01-18 — End: 2023-07-19

## 2023-01-18 NOTE — Progress Notes (Signed)
Endocrinology Consult Note                                            01/18/2023, 6:05 PM   Subjective:    Patient ID: Savannah Martin, female    DOB: 03/18/1981, PCP Donell Beers, FNP   Past Medical History:  Diagnosis Date   Anxiety    CKD (chronic kidney disease)    CKD (chronic kidney disease), stage III (HCC)    Hyperparathyroidism (HCC)    Hypertension    LGSIL on Pap smear of cervix 2015   Proteinuria    History reviewed. No pertinent surgical history. Social History   Socioeconomic History   Marital status: Single    Spouse name: Not on file   Number of children: 4   Years of education: Not on file   Highest education level: Not on file  Occupational History   Not on file  Tobacco Use   Smoking status: Every Day    Packs/day: .5    Types: Cigarettes   Smokeless tobacco: Never   Tobacco comments:    Started smoking cigarettes at age 89, smokes 0.5 [pack daily.   Vaping Use   Vaping Use: Never used  Substance and Sexual Activity   Alcohol use: Yes    Comment: occasionally   Drug use: Yes    Types: Marijuana    Comment: occasionally   Sexual activity: Yes  Other Topics Concern   Not on file  Social History Narrative   Lives with her children    Social Determinants of Health   Financial Resource Strain: Not on file  Food Insecurity: Not on file  Transportation Needs: Not on file  Physical Activity: Not on file  Stress: Not on file  Social Connections: Not on file   Family History  Problem Relation Age of Onset   Hypertension Mother    Stroke Mother 67   Anuerysm Mother    Hypertension Father    Epilepsy Sister    Bipolar disorder Sister    Cancer - Cervical Sister    Stroke Maternal Grandmother    Breast cancer Neg Hx    Lung cancer Neg Hx    Outpatient Encounter Medications as of 01/18/2023  Medication Sig   albuterol (VENTOLIN HFA) 108 (90 Base) MCG/ACT inhaler Inhale 2 puffs into the lungs every 6 (six) hours as needed  for wheezing or shortness of breath.   ALPRAZolam (XANAX) 0.5 MG tablet TAKE 1/2 TO 1 TABLET UP TO 3 TIMES A DAY AS NEEDED (Patient not taking: Reported on 01/18/2023)   cloNIDine (CATAPRES) 0.2 MG tablet Take 1 tablet (0.2 mg total) by mouth 2 (two) times daily.   cyclobenzaprine (FLEXERIL) 5 MG tablet TAKE 1 TABLET (5 MG TOTAL) BY MOUTH 3 (THREE) TIMES DAILY AS NEEDED FOR MUSCLE SPASMS.   diclofenac Sodium (VOLTAREN) 1 % GEL Apply 4 g topically 4 (four) times daily.   diltiazem (CARDIZEM CD) 240 MG 24 hr capsule Take 1 capsule (240 mg total) by mouth daily.   furosemide (LASIX) 20 MG tablet Take 1 tablet (20 mg total) by mouth daily.   HYDROcodone-acetaminophen (NORCO/VICODIN) 5-325 MG tablet Take 1 tablet by mouth every 4 (four) hours as needed for moderate pain. (Patient not taking: Reported on 01/18/2023)   labetalol (NORMODYNE) 200 MG tablet Take 2 tablets (400 mg total)  by mouth 2 (two) times daily.   potassium chloride (KLOR-CON M) 10 MEQ tablet Take 1 tablet (10 mEq total) by mouth daily.   senna (SENOKOT) 8.6 MG TABS tablet Take 2 tablets by mouth at bedtime as needed.   sertraline (ZOLOFT) 100 MG tablet Take 1 tablet (100 mg total) by mouth daily.   Varenicline Tartrate, Starter, (CHANTIX STARTING MONTH PAK) 0.5 MG X 11 & 1 MG X 42 TBPK Take as instructed on packaging   [DISCONTINUED] diltiazem (CARDIZEM CD) 240 MG 24 hr capsule Take 1 capsule (240 mg total) by mouth daily.   [DISCONTINUED] norethindrone (MICRONOR) 0.35 MG tablet TAKE 1 TABLET BY ORAL ROUTE  EVERY DAY (Patient not taking: Reported on 10/18/2022)   No facility-administered encounter medications on file as of 01/18/2023.   ALLERGIES: Allergies  Allergen Reactions   Latex Rash   Sulfa Antibiotics Rash    VACCINATION STATUS: Immunization History  Administered Date(s) Administered   PFIZER(Purple Top)SARS-COV-2 Vaccination 02/25/2021    HPI Savannah Martin is 42 y.o. female who presents today with a medical history  as above. she is being seen in consultation for elevated PTH, and multinodular goiter requested by Donell Beers, FNP.  Patient is not an optimal historian.  History is obtained directly from the patient as well as chart review.  Patient has CKD stage 3 with a GFR of 44.  She was found to have elevated PTH of 154 associated with calcium of 10 , 3 months ago in February 2024.  She does not have recent thyroid function test to review. She does not have hypercalcemia.  She denies prior history of nephrolithiasis nor osteoporosis.  She is not on calcium supplements.  She has uncontrolled hypertension causing CKD.  She remains a smoker. Recently, she was also found to have multinodular goiter based on ultrasound of thyroid performed in May 2023. Findings are summarized as follows.  There was one suspicious nodule on the right lobe, biopsy was suggested.  She denies dysphagia, shortness of breath, nor voice change. Patient denies any prior history of biopsy, thyroid surgery nor ablation. She denies any family history of thyroid dysfunction or thyroid malignancy.  Review of Systems  Constitutional: + Mildly fluctuating body weight, no fatigue, no subjective hyperthermia, no subjective hypothermia Eyes: no blurry vision, no xerophthalmia ENT: no sore throat, + goiter, no dysphagia/odynophagia, no hoarseness Cardiovascular: no Chest Pain, no Shortness of Breath, no palpitations, no leg swelling Respiratory: no cough, no shortness of breath Gastrointestinal: no Nausea/Vomiting/Diarhhea Musculoskeletal: no muscle/joint aches Skin: no rashes Neurological: no tremors, no numbness, no tingling, no dizziness Psychiatric: no depression, no anxiety  Objective:       01/18/2023    1:58 PM 01/18/2023    1:37 PM 11/25/2022    2:15 PM  Vitals with BMI  Height  5\' 2"    Weight  185 lbs   BMI  33.83   Systolic 170 168 540  Diastolic 118 108 94  Pulse  76     BP (!) 170/118 Comment: L arm with  manuel cuff. Pt states she has been out of her cardizem x 2 days. Dr.Jaanai Salemi made aware.  Pulse 76   Ht 5\' 2"  (1.575 m)   Wt 185 lb (83.9 kg)   BMI 33.84 kg/m   Wt Readings from Last 3 Encounters:  01/18/23 185 lb (83.9 kg)  11/25/22 195 lb 9.6 oz (88.7 kg)  11/19/22 194 lb (88 kg)    Physical Exam  Constitutional:  Body mass  index is 33.84 kg/m.,  not in acute distress, normal state of mind Eyes: PERRLA, EOMI, no exophthalmos ENT: moist mucous membranes, + gross thyromegaly, no gross cervical lymphadenopathy Cardiovascular: normal precordial activity, Regular Rate and Rhythm, no Murmur/Rubs/Gallops Respiratory:  adequate breathing efforts, no gross chest deformity, Clear to auscultation bilaterally Gastrointestinal: abdomen soft, Non -tender, No distension, Bowel Sounds present, no gross organomegaly Musculoskeletal: no gross deformities, strength intact in all four extremities, no peripheral edema Skin: moist, warm, no rashes Neurological: no tremor with outstretched hands, Deep tendon reflexes normal in bilateral lower extremities.  CMP ( most recent) CMP     Component Value Date/Time   NA 139 10/18/2022 1227   K 3.7 10/18/2022 1227   CL 106 10/18/2022 1227   CO2 19 (L) 10/18/2022 1227   GLUCOSE 91 10/18/2022 1227   BUN 19 10/18/2022 1227   CREATININE 1.50 (H) 10/18/2022 1227   CALCIUM 10.0 10/18/2022 1227   PROT 6.8 10/18/2022 1227   ALBUMIN 3.8 (L) 10/18/2022 1227   AST 15 10/18/2022 1227   ALT 15 10/18/2022 1227   ALKPHOS 118 10/18/2022 1227   BILITOT 0.3 10/18/2022 1227     Diabetic Labs (most recent): Lab Results  Component Value Date   HGBA1C 5.3 10/18/2022   Thyroid ultrasound: Right lobe  7.8 cm with 3 nodules measuring 1.5 cm, 1.8 cm, 1.2 cm.  The 1.2 cm nodule was described as TR 5 and recommended for FNA.  Left lobe measures 7 cm, with 1 nodule measuring 1.2 cm    Assessment & Plan:   1. Hypertension, unspecified type 2. Other hyperparathyroidism  (HCC) 3. Multinodular goiter 4. Current smoke   - Savannah Martin  is being seen at a kind request of [place], Baird Kay, FNP. - I have reviewed her available  records and clinically evaluated the patient. - Based on these reviews, she has elevated PTH and multinodular goiter,  however,  there is not sufficient information to proceed with definitive treatment plan.  - she will need a repeat,  more complete parathyroid assessment with PTH, magnesium, phosphorus, PTH RP towards confirming the diagnosis.  I discussed her thyroid ultrasound findings with her, ordered fine-needle aspiration biopsy and she agrees.  She will also have full set thyroid function test before her next visit. Her major medical problem is her uncontrolled hypertension currently measuring 170/118.  She has been out of her Cardizem for a few days.  I will reorder this medication at 240 mg p.o. daily.  She also has labetalol 200 mg p.o. twice daily, clonidine 0.2 mg p.o. twice daily.  She is advised to be consistent with her medications.  She is advised to make appointment for hypertension with her PCP. She is also a active smoker, will be approached for strategies to quit smoking on next visit.   - she is advised to maintain close follow up with Donell Beers, FNP for primary care needs.   - Time spent with the patient: 45 minutes, of which >50% was spent in  counseling her about her elevated PTH, multinodular goiter, hypertension and the rest in obtaining information about her symptoms, reviewing her previous labs/studies ( including abstractions from other facilities),  evaluations, and treatments,  and developing a plan to confirm diagnosis and long term treatment based on the latest standards of care/guidelines; and documenting her care.  Savannah Martin participated in the discussions, expressed understanding, and voiced agreement with the above plans.  All questions were answered to her satisfaction. she is  encouraged to contact clinic should she have any questions or concerns prior to her return visit.  Follow up plan: Return in about 4 weeks (around 02/15/2023) for F/U with Biopsy Results, F/U with Pre-visit Labs.   Marquis Lunch, MD [facility] [address] [place], [place] [address] Phone: [phone]  Fax: [phone]     01/18/2023, 6:05 PM  This note was partially dictated with voice recognition software. Similar sounding words can be transcribed inadequately or may not  be corrected upon review.

## 2023-01-19 ENCOUNTER — Encounter: Payer: Self-pay | Admitting: Student

## 2023-01-20 ENCOUNTER — Other Ambulatory Visit: Payer: Self-pay | Admitting: Nurse Practitioner

## 2023-01-20 DIAGNOSIS — I10 Essential (primary) hypertension: Secondary | ICD-10-CM

## 2023-01-24 NOTE — Telephone Encounter (Signed)
Please advise KH 

## 2023-01-27 DIAGNOSIS — E2609 Other primary hyperaldosteronism: Secondary | ICD-10-CM | POA: Diagnosis not present

## 2023-01-27 DIAGNOSIS — E210 Primary hyperparathyroidism: Secondary | ICD-10-CM | POA: Diagnosis not present

## 2023-01-27 DIAGNOSIS — I129 Hypertensive chronic kidney disease with stage 1 through stage 4 chronic kidney disease, or unspecified chronic kidney disease: Secondary | ICD-10-CM | POA: Diagnosis not present

## 2023-01-27 DIAGNOSIS — E876 Hypokalemia: Secondary | ICD-10-CM | POA: Diagnosis not present

## 2023-01-27 DIAGNOSIS — N1832 Chronic kidney disease, stage 3b: Secondary | ICD-10-CM | POA: Diagnosis not present

## 2023-01-28 DIAGNOSIS — Z419 Encounter for procedure for purposes other than remedying health state, unspecified: Secondary | ICD-10-CM | POA: Diagnosis not present

## 2023-01-30 ENCOUNTER — Other Ambulatory Visit (HOSPITAL_COMMUNITY): Payer: Self-pay | Admitting: Nephrology

## 2023-01-30 DIAGNOSIS — E210 Primary hyperparathyroidism: Secondary | ICD-10-CM

## 2023-02-07 ENCOUNTER — Other Ambulatory Visit: Payer: Self-pay | Admitting: "Endocrinology

## 2023-02-07 ENCOUNTER — Inpatient Hospital Stay
Admission: RE | Admit: 2023-02-07 | Discharge: 2023-02-07 | Disposition: A | Discharge status: Home / Self Care | Payer: Self-pay | Source: Ambulatory Visit | Attending: "Endocrinology | Admitting: "Endocrinology

## 2023-02-07 DIAGNOSIS — E042 Nontoxic multinodular goiter: Secondary | ICD-10-CM

## 2023-02-09 ENCOUNTER — Ambulatory Visit: Payer: Medicaid Other | Attending: Internal Medicine | Admitting: Internal Medicine

## 2023-02-09 ENCOUNTER — Encounter: Payer: Self-pay | Admitting: Internal Medicine

## 2023-02-09 ENCOUNTER — Other Ambulatory Visit: Payer: Self-pay | Admitting: Nephrology

## 2023-02-09 VITALS — BP 152/86 | HR 69 | Ht 62.0 in | Wt 188.6 lb

## 2023-02-09 DIAGNOSIS — E2609 Other primary hyperaldosteronism: Secondary | ICD-10-CM

## 2023-02-09 DIAGNOSIS — I10 Essential (primary) hypertension: Secondary | ICD-10-CM | POA: Diagnosis not present

## 2023-02-09 MED ORDER — FUROSEMIDE 20 MG PO TABS
40.0000 mg | ORAL_TABLET | Freq: Every day | ORAL | 1 refills | Status: DC
Start: 2023-02-09 — End: 2023-03-24

## 2023-02-09 MED ORDER — CARVEDILOL 25 MG PO TABS: 25.0000 mg | ORAL_TABLET | Freq: Two times a day (BID) | ORAL | 3 refills | Status: DC

## 2023-02-09 NOTE — Patient Instructions (Addendum)
Medication Instructions:  STOP LABETALOL  INCREASE  LASIX TO 40MG  START CARVEDILOL 25MG  TWICE A  DAY *If you need a refill on your cardiac medications before your next appointment, please call your pharmacy*   Lab Work: BMET IN 2 WEEK   Our in office lab hours are Monday-Friday 8:00-4:00, closed for lunch 12:45-1:45 pm.  No appointment needed.  [facility] locations:   [facility] - [address]  - [address] ([facility]) - [address] [phone] [address]   [place] - [address]    [place]  - [address]    [place] - [address] - [address] [employer]  - [address] - [address] Chief Technology Officer)   If you have labs (blood work) drawn today and your tests are completely normal, you will receive your results only by: [employer] (if you have MyChart) OR A paper copy in the mail If you have any lab test that is abnormal or we need to change your treatment, we will call you to review the results.   Follow-Up: At [facility], you and your health needs are our priority.  As part of our continuing mission to provide you with exceptional heart care, we have created designated Provider Care Teams.  These Care Teams include your primary Cardiologist (physician) and Advanced Practice Providers (APPs -  Physician Assistants and Nurse Practitioners) who all work together to provide you with the care you need, when you need it.  We recommend signing up for the patient portal called "MyChart".  Sign up information is provided on this After Visit Summary.  MyChart is used to connect with patients for Virtual Visits (Telemedicine).  Patients are able to view lab/test results, encounter notes, upcoming appointments, etc.  Non-urgent messages can be sent to your provider as well.   To learn more about what you can do with  MyChart, go to [email or web address].    Your next appointment:   1 month with APP 6 MONTH FOLLOW UP DR. BRANCH  Provider:   Maisie Fus, MD

## 2023-02-09 NOTE — Progress Notes (Signed)
Cardiology Office Note:    Date:  02/09/2023   ID:  Savannah Martin, DOB [birth year], MRN [id number]  PCP:  Donell Beers, FNP   [facility] Providers Cardiologist:  None     Referring MD: Donell Beers, FNP   No chief complaint on file. HTN  History of Present Illness:    Savannah Martin is a 42 y.o. female with a hx of CKD stage 3 GFR 44, Crt 1.5 ,  referral for HTN/elevated BNP. She has had CKD for ~ 2-3 years per patient.  BP at home elevated. Has had HTN since she had her first child, 19 years ago. She has 4 boys, all healthy She denies angina,  She reports sleeping with pillows. She states she has low energy. She notes she gets easily fatigued. Notes DOE. She's had edema. Can have PND Mother had hx of stroke.  Smoking, prescribed chantix  Past Medical History:  Diagnosis Date   Anxiety    CKD (chronic kidney disease)    CKD (chronic kidney disease), stage III (HCC)    Hyperparathyroidism (HCC)    Hypertension    LGSIL on Pap smear of cervix 2015   Proteinuria     No past surgical history on file.  Current Medications: No outpatient medications have been marked as taking for the 02/09/23 encounter (Appointment) with Maisie Fus, MD.     Allergies:   Latex and Sulfa antibiotics   Social History   Socioeconomic History   Marital status: Single    Spouse name: Not on file   Number of children: 4   Years of education: Not on file   Highest education level: Not on file  Occupational History   Not on file  Tobacco Use   Smoking status: Every Day    Packs/day: .5    Types: Cigarettes   Smokeless tobacco: Never   Tobacco comments:    Started smoking cigarettes at age 89, smokes 0.5 [pack daily.   Vaping Use   Vaping Use: Never used  Substance and Sexual Activity   Alcohol use: Yes    Comment: occasionally   Drug use: Yes    Types: Marijuana    Comment: occasionally   Sexual activity: Yes  Other Topics Concern   Not on file   Social History Narrative   Lives with her children    Social Determinants of Health   Financial Resource Strain: Not on file  Food Insecurity: Not on file  Transportation Needs: Not on file  Physical Activity: Not on file  Stress: Not on file  Social Connections: Not on file     Family History: The patient's family history includes Anuerysm in her mother; Bipolar disorder in her sister; Cancer - Cervical in her sister; Epilepsy in her sister; Hypertension in her father and mother; Stroke in her maternal grandmother; Stroke (age of onset: 53) in her mother. There is no history of Breast cancer or Lung cancer.  ROS:   Please see the history of present illness.     All other systems reviewed and are negative.  EKGs/Labs/Other Studies Reviewed:    The following studies were reviewed today:   TTE  EF 60-65% RV is normal Mild MR Mild PHTN 46 mmhg LA size 53 cc/m2   EKG:  EKG is  ordered today.  The ekg ordered today demonstrates   02/09/2023- NSR, diffuse TWI  Recent Labs: 10/18/2022: ALT 15; BUN 19; Creatinine, Ser 1.50; Potassium 3.7; Sodium 139 11/25/2022: BNP 1,101.9  Recent Lipid Panel No results found for: "CHOL", "TRIG", "HDL", "CHOLHDL", "VLDL", "LDLCALC", "LDLDIRECT"   Risk Assessment/Calculations:     Physical Exam:    VS:   Vitals:   02/09/23 1328  BP: (!) 152/86  Pulse: 69  SpO2: 99%     Wt Readings from Last 3 Encounters:  01/18/23 185 lb (83.9 kg)  11/25/22 195 lb 9.6 oz (88.7 kg)  11/19/22 194 lb (88 kg)     GEN:  Well nourished, well developed in no acute distress HEENT: Normal NECK: mild JVD LYMPHATICS: No lymphadenopathy CARDIAC: RRR, no murmurs, rubs, gallops RESPIRATORY:  Clear to auscultation without rales, wheezing or rhonchi  ABDOMEN: Soft, non-tender, non-distended MUSCULOSKELETAL:  trace edema BL; No deformity  SKIN: Warm and dry NEUROLOGIC:  Alert and oriented x 3 PSYCHIATRIC:  Normal affect   ASSESSMENT:    HTN/elevated  BNP; in the setting CKD related to long-standing HTN. Has elevated LVEDP. Will increase her lasix today and change labetolol to coreg. Can consider hydralazine if Bps not well managed on this regimen. PLAN:    In order of problems listed above:   - change labetalol to coreg 25 mg BID - can continue spironolactone 50 mg daily unless Crt >2 or K >4 - can continue diltiazem 240 mg daily - continue clonidine 0.2 mg BID - increase lasix 20 mg daily to 40 mg daily - BMET 2 weeks - FU with an APP in 1 month - Follow up in 6 months     Medication Adjustments/Labs and Tests Ordered: Current medicines are reviewed at length with the patient today.  Concerns regarding medicines are outlined above.  No orders of the defined types were placed in this encounter.  No orders of the defined types were placed in this encounter.   There are no Patient Instructions on file for this visit.   Signed, Maisie Fus, MD  02/09/2023 12:03 PM    [facility]

## 2023-02-16 ENCOUNTER — Encounter (HOSPITAL_COMMUNITY)
Admission: RE | Admit: 2023-02-16 | Discharge: 2023-02-16 | Disposition: A | Discharge status: Home / Self Care | Payer: Medicaid Other | Source: Ambulatory Visit | Attending: Nephrology | Admitting: Nephrology

## 2023-02-16 DIAGNOSIS — E210 Primary hyperparathyroidism: Secondary | ICD-10-CM | POA: Insufficient documentation

## 2023-02-16 MED ORDER — TECHNETIUM TC 99M SESTAMIBI - CARDIOLITE
23.9000 | Freq: Once | INTRAVENOUS | Status: AC
  Administered 2023-02-16: 23.9 via INTRAVENOUS

## 2023-02-20 ENCOUNTER — Other Ambulatory Visit: Payer: Self-pay | Admitting: Nurse Practitioner

## 2023-02-20 ENCOUNTER — Ambulatory Visit: Payer: Medicaid Other | Admitting: "Endocrinology

## 2023-02-20 DIAGNOSIS — F419 Anxiety disorder, unspecified: Secondary | ICD-10-CM

## 2023-02-20 NOTE — Telephone Encounter (Signed)
Please advise KH 

## 2023-02-26 NOTE — Progress Notes (Deleted)
Cardiology Office Note:    Date:  02/26/2023   ID:  Savannah Martin, DOB Mar 02, 1981, MRN [id number]  PCP:  Donell Beers, FNP  Cardiologist:  Maisie Fus, MD  Electrophysiologist:  None   Referring MD: Donell Beers, FNP   Chief Complaint: follow-up of CHF and hypertension  History of Present Illness:    Savannah Martin is a 42 y.o. female with a history of chronic diastolic CHF,  hypertension, CKD stage III, hyperparathyroidism, anxiety, and tobacco abuse who is followed by Dr. Carolan Clines and presents today for follow-up of CHF and hypertension.  Patient was seen by her PCP in 10/2022 and reported shortness of breath with  exertion and left leg swelling after an injury. BNP was ordered and came back markedly elevated at 1,101. Therefore, an Echo was ordered and showed LVEF of 60-65% with normal wall motion, severe LVH, and grade 2 diastolic dysfunction as well as normal RV size and function with mildly elevated PASP of 46.6 mmHg, severe left atrial enlargement, and mild MR. She was referred to Cardiology and seen by Dr. Carolan Clines on 02/09/2023. She reported being diagnosed with hypertension when she was pregnant with her first child 19 years ago. She also reported dyspnea on exertion and low energy. BP was 152/86 in the office. Per Dr. Verna Czech note, LVEDP was elevated so Lasix was increased from 20mg  to 40mg  daily. Labetalol was also stopped and she was started on Coreg 25mg  twice daily for better BP control.  Patient presents today for follow-up. ***  Chronic Diastolic CHF BNP markedly elevated at 1,101 in 10/2022. Echo in 12/2022 showed LVEF of 60-65% with normal wall motion, severe LVH, and grade 2 diastolic dysfunction as well as normal RV size and function with mildly elevated PASP of 46.6 mmHg, severe left atrial enlargement, and mild MR. Per Dr. Verna Czech review of report, LVEDP was elevated. Lasix was increased at last visit. - *** - Continue Lasix 40mg  daily.  -  Continue Spironolactone 50mg  daily.  - Discussed importance of daily weight and sodium/ fluid restrictions.   Hypertension *** - Continue current medications: Coreg 25mg  twice daily, Diltiazem 240mg  daily, Spironolactone 50mg  daily, and Clonidine 0.2mg  twice daily. Switch Diltiazem to Amlodipine *** - In regards to secondary causes of hypertension. TSH has been normal in 11/2021 when she presented to the ED for hypertensive emergency. Aldosterone and Renin labs at that time showed an Aldosterone level of 20 and Renin activity plasma level of <0.6 (with hyperaldosteronism, PAC is usually >10 and PRA is usually <1 so this may be a possibility). She is scheduled to have a abdominal CT later this month and already follows with a Endocrinologist. *** Sleep study and renal dopplers ***  CKD Stage III Baseline creatinine around 1.5. ***   EKGs/Labs/Other Studies Reviewed:    The following studies were reviewed:  Echocardiogram 01/03/2023: Impressions: 1. Left ventricular ejection fraction, by estimation, is 60 to 65%. The  left ventricle has normal function. The left ventricle has no regional  wall motion abnormalities. There is severe concentric left ventricular  hypertrophy. Left ventricular diastolic   parameters are consistent with Grade II diastolic dysfunction  (pseudonormalization).   2. Right ventricular systolic function is normal. The right ventricular  size is normal. There is mildly elevated pulmonary artery systolic  pressure. The estimated right ventricular systolic pressure is 46.6 mmHg.   3. Left atrial size was severely dilated.   4. The mitral valve is grossly normal. Mild mitral valve  regurgitation.  No evidence of mitral stenosis.   5. The aortic valve is tricuspid. Aortic valve regurgitation is not  visualized. No aortic stenosis is present.   6. The inferior vena cava is normal in size with greater than 50%  respiratory variability, suggesting right atrial pressure of 3  mmHg.    EKG:  EKG not ordered today.   Recent Labs: 10/18/2022: ALT 15; BUN 19; Creatinine, Ser 1.50; Potassium 3.7; Sodium 139 11/25/2022: BNP 1,101.9  Recent Lipid Panel No results found for: "CHOL", "TRIG", "HDL", "CHOLHDL", "VLDL", "LDLCALC", "LDLDIRECT"  Physical Exam:    Vital Signs: LMP 02/09/2023     Wt Readings from Last 3 Encounters:  02/09/23 188 lb 9.6 oz (85.5 kg)  01/18/23 185 lb (83.9 kg)  11/25/22 195 lb 9.6 oz (88.7 kg)     General: 42 y.o. female in no acute distress. HEENT: Normocephalic and atraumatic. Sclera clear. EOMs intact. Neck: Supple. No carotid bruits. No JVD. Heart: *** RRR. Distinct S1 and S2. No murmurs, gallops, or rubs. Radial and distal pedal pulses 2+ and equal bilaterally. Lungs: No increased work of breathing. Clear to ausculation bilaterally. No wheezes, rhonchi, or rales.  Abdomen: Soft, non-distended, and non-tender to palpation. Bowel sounds present in all 4 quadrants.  MSK: Normal strength and tone for age. *** Extremities: No lower extremity edema.    Skin: Warm and dry. Neuro: Alert and oriented x3. No focal deficits. Psych: Normal affect. Responds appropriately.   Assessment:    No diagnosis found.  Plan:     Disposition: Follow up in ***   Medication Adjustments/Labs and Tests Ordered: Current medicines are reviewed at length with the patient today.  Concerns regarding medicines are outlined above.  No orders of the defined types were placed in this encounter.  No orders of the defined types were placed in this encounter.   There are no Patient Instructions on file for this visit.   Signed, Corrin Parker, PA-C  02/26/2023 9:15 AM    [facility]

## 2023-02-27 DIAGNOSIS — Z419 Encounter for procedure for purposes other than remedying health state, unspecified: Secondary | ICD-10-CM | POA: Diagnosis not present

## 2023-02-28 ENCOUNTER — Encounter: Payer: Self-pay | Admitting: Nephrology

## 2023-02-28 DIAGNOSIS — E2609 Other primary hyperaldosteronism: Secondary | ICD-10-CM | POA: Diagnosis not present

## 2023-02-28 DIAGNOSIS — E042 Nontoxic multinodular goiter: Secondary | ICD-10-CM | POA: Diagnosis not present

## 2023-02-28 DIAGNOSIS — F1210 Cannabis abuse, uncomplicated: Secondary | ICD-10-CM | POA: Diagnosis not present

## 2023-02-28 DIAGNOSIS — Z79899 Other long term (current) drug therapy: Secondary | ICD-10-CM | POA: Diagnosis not present

## 2023-02-28 DIAGNOSIS — I10 Essential (primary) hypertension: Secondary | ICD-10-CM | POA: Diagnosis not present

## 2023-02-28 DIAGNOSIS — N289 Disorder of kidney and ureter, unspecified: Secondary | ICD-10-CM | POA: Diagnosis not present

## 2023-02-28 DIAGNOSIS — F17210 Nicotine dependence, cigarettes, uncomplicated: Secondary | ICD-10-CM | POA: Diagnosis not present

## 2023-02-28 DIAGNOSIS — E210 Primary hyperparathyroidism: Secondary | ICD-10-CM | POA: Diagnosis not present

## 2023-03-08 ENCOUNTER — Ambulatory Visit: Payer: Medicaid Other | Attending: Student | Admitting: Student

## 2023-03-10 ENCOUNTER — Ambulatory Visit
Admission: RE | Admit: 2023-03-10 | Discharge: 2023-03-10 | Disposition: A | Discharge status: Home / Self Care | Payer: Medicaid Other | Source: Ambulatory Visit | Attending: Nephrology | Admitting: Nephrology

## 2023-03-10 DIAGNOSIS — E269 Hyperaldosteronism, unspecified: Secondary | ICD-10-CM | POA: Diagnosis not present

## 2023-03-10 DIAGNOSIS — E2609 Other primary hyperaldosteronism: Secondary | ICD-10-CM

## 2023-03-10 DIAGNOSIS — I10 Essential (primary) hypertension: Secondary | ICD-10-CM | POA: Diagnosis not present

## 2023-03-10 DIAGNOSIS — N183 Chronic kidney disease, stage 3 unspecified: Secondary | ICD-10-CM | POA: Diagnosis not present

## 2023-03-10 MED ORDER — IOPAMIDOL (ISOVUE-300) INJECTION 61%
100.0000 mL | Freq: Once | INTRAVENOUS | Status: AC | PRN
  Administered 2023-03-10: 100 mL via INTRAVENOUS

## 2023-03-21 DIAGNOSIS — N1832 Chronic kidney disease, stage 3b: Secondary | ICD-10-CM | POA: Diagnosis not present

## 2023-03-21 DIAGNOSIS — E876 Hypokalemia: Secondary | ICD-10-CM | POA: Diagnosis not present

## 2023-03-21 DIAGNOSIS — E210 Primary hyperparathyroidism: Secondary | ICD-10-CM | POA: Diagnosis not present

## 2023-03-21 DIAGNOSIS — I129 Hypertensive chronic kidney disease with stage 1 through stage 4 chronic kidney disease, or unspecified chronic kidney disease: Secondary | ICD-10-CM | POA: Diagnosis not present

## 2023-03-21 DIAGNOSIS — E2609 Other primary hyperaldosteronism: Secondary | ICD-10-CM | POA: Diagnosis not present

## 2023-03-24 ENCOUNTER — Other Ambulatory Visit: Payer: Self-pay | Admitting: Internal Medicine

## 2023-03-24 ENCOUNTER — Other Ambulatory Visit: Payer: Self-pay | Admitting: Nurse Practitioner

## 2023-03-24 DIAGNOSIS — G8929 Other chronic pain: Secondary | ICD-10-CM

## 2023-03-24 DIAGNOSIS — I10 Essential (primary) hypertension: Secondary | ICD-10-CM

## 2023-03-30 DIAGNOSIS — Z419 Encounter for procedure for purposes other than remedying health state, unspecified: Secondary | ICD-10-CM | POA: Diagnosis not present

## 2023-04-18 ENCOUNTER — Ambulatory Visit: Payer: Self-pay | Admitting: Nurse Practitioner

## 2023-04-25 DIAGNOSIS — E2609 Other primary hyperaldosteronism: Secondary | ICD-10-CM | POA: Diagnosis not present

## 2023-04-30 DIAGNOSIS — Z419 Encounter for procedure for purposes other than remedying health state, unspecified: Secondary | ICD-10-CM | POA: Diagnosis not present

## 2023-05-19 ENCOUNTER — Telehealth: Payer: Self-pay

## 2023-05-19 NOTE — Patient Outreach (Signed)
Care Guide Note  05/19/2023 Name: Savannah Martin MRN: [id number] DOB: 12-Jul-1981  Referred by: Donell Beers, FNP Reason for referral : patient outreach (Outreach to schedule with RPH.)   Savannah Martin is a 42 y.o. year old female who is a primary care patient of Donell Beers, FNP. Savannah Martin was referred to the pharmacist for assistance related to HTN.    Successful contact was made with the patient to discuss pharmacy services. Patient declines engagement at this time. Contact information was provided to the patient should they wish to reach out for assistance at a later time.  Baruch Gouty [facility] Assistant-Population Health [phone]

## 2023-05-24 DIAGNOSIS — E042 Nontoxic multinodular goiter: Secondary | ICD-10-CM | POA: Diagnosis not present

## 2023-05-24 DIAGNOSIS — E2609 Other primary hyperaldosteronism: Secondary | ICD-10-CM | POA: Diagnosis not present

## 2023-05-30 ENCOUNTER — Other Ambulatory Visit: Payer: Self-pay | Admitting: Nurse Practitioner

## 2023-05-30 DIAGNOSIS — G8929 Other chronic pain: Secondary | ICD-10-CM

## 2023-05-30 DIAGNOSIS — E210 Primary hyperparathyroidism: Secondary | ICD-10-CM | POA: Diagnosis not present

## 2023-05-30 DIAGNOSIS — N1832 Chronic kidney disease, stage 3b: Secondary | ICD-10-CM | POA: Diagnosis not present

## 2023-05-30 DIAGNOSIS — E876 Hypokalemia: Secondary | ICD-10-CM | POA: Diagnosis not present

## 2023-05-30 DIAGNOSIS — I129 Hypertensive chronic kidney disease with stage 1 through stage 4 chronic kidney disease, or unspecified chronic kidney disease: Secondary | ICD-10-CM | POA: Diagnosis not present

## 2023-05-30 DIAGNOSIS — Z419 Encounter for procedure for purposes other than remedying health state, unspecified: Secondary | ICD-10-CM | POA: Diagnosis not present

## 2023-05-30 DIAGNOSIS — E2609 Other primary hyperaldosteronism: Secondary | ICD-10-CM | POA: Diagnosis not present

## 2023-06-15 DIAGNOSIS — I10 Essential (primary) hypertension: Secondary | ICD-10-CM | POA: Diagnosis not present

## 2023-06-15 DIAGNOSIS — E2609 Other primary hyperaldosteronism: Secondary | ICD-10-CM | POA: Diagnosis not present

## 2023-06-15 DIAGNOSIS — E269 Hyperaldosteronism, unspecified: Secondary | ICD-10-CM | POA: Diagnosis not present

## 2023-06-15 DIAGNOSIS — Z91040 Latex allergy status: Secondary | ICD-10-CM | POA: Diagnosis not present

## 2023-06-16 ENCOUNTER — Other Ambulatory Visit: Payer: Self-pay | Admitting: Nurse Practitioner

## 2023-06-16 DIAGNOSIS — I10 Essential (primary) hypertension: Secondary | ICD-10-CM

## 2023-06-30 DIAGNOSIS — Z419 Encounter for procedure for purposes other than remedying health state, unspecified: Secondary | ICD-10-CM | POA: Diagnosis not present

## 2023-07-14 DIAGNOSIS — E2609 Other primary hyperaldosteronism: Secondary | ICD-10-CM | POA: Diagnosis not present

## 2023-07-19 ENCOUNTER — Encounter: Payer: Self-pay | Admitting: Nurse Practitioner

## 2023-07-19 ENCOUNTER — Ambulatory Visit (INDEPENDENT_AMBULATORY_CARE_PROVIDER_SITE_OTHER): Payer: Self-pay | Admitting: Nurse Practitioner

## 2023-07-19 VITALS — BP 187/127 | HR 88 | Temp 97.2°F | Wt 195.0 lb

## 2023-07-19 DIAGNOSIS — I10 Essential (primary) hypertension: Secondary | ICD-10-CM | POA: Diagnosis not present

## 2023-07-19 DIAGNOSIS — F17210 Nicotine dependence, cigarettes, uncomplicated: Secondary | ICD-10-CM

## 2023-07-19 DIAGNOSIS — F32A Depression, unspecified: Secondary | ICD-10-CM

## 2023-07-19 DIAGNOSIS — F17200 Nicotine dependence, unspecified, uncomplicated: Secondary | ICD-10-CM

## 2023-07-19 DIAGNOSIS — E2609 Other primary hyperaldosteronism: Secondary | ICD-10-CM | POA: Diagnosis not present

## 2023-07-19 DIAGNOSIS — M545 Low back pain, unspecified: Secondary | ICD-10-CM | POA: Diagnosis not present

## 2023-07-19 DIAGNOSIS — F419 Anxiety disorder, unspecified: Secondary | ICD-10-CM

## 2023-07-19 DIAGNOSIS — N1831 Chronic kidney disease, stage 3a: Secondary | ICD-10-CM

## 2023-07-19 DIAGNOSIS — G8929 Other chronic pain: Secondary | ICD-10-CM | POA: Diagnosis not present

## 2023-07-19 MED ORDER — GABAPENTIN 100 MG PO CAPS
100.0000 mg | ORAL_CAPSULE | Freq: Three times a day (TID) | ORAL | 3 refills | Status: DC
Start: 2023-07-19 — End: 2023-12-25

## 2023-07-19 MED ORDER — CARVEDILOL 25 MG PO TABS
25.0000 mg | ORAL_TABLET | Freq: Two times a day (BID) | ORAL | 3 refills | Status: DC
Start: 2023-07-19 — End: 2024-07-19

## 2023-07-19 MED ORDER — FUROSEMIDE 40 MG PO TABS
40.0000 mg | ORAL_TABLET | Freq: Every day | ORAL | 0 refills | Status: DC
Start: 2023-07-19 — End: 2023-11-20

## 2023-07-19 MED ORDER — SPIRONOLACTONE 50 MG PO TABS: 50.0000 mg | ORAL_TABLET | Freq: Every day | ORAL | 0 refills | Status: DC

## 2023-07-19 MED ORDER — SERTRALINE HCL 100 MG PO TABS: 100.0000 mg | ORAL_TABLET | Freq: Every day | ORAL | 1 refills | Status: DC

## 2023-07-19 MED ORDER — CYCLOBENZAPRINE HCL 5 MG PO TABS
5.0000 mg | ORAL_TABLET | Freq: Three times a day (TID) | ORAL | 1 refills | Status: DC | PRN
Start: 2023-07-19 — End: 2023-08-18

## 2023-07-19 MED ORDER — CLONIDINE HCL 0.2 MG PO TABS
0.2000 mg | ORAL_TABLET | Freq: Two times a day (BID) | ORAL | 1 refills | Status: DC
Start: 2023-07-19 — End: 2024-03-11

## 2023-07-19 MED ORDER — DILTIAZEM HCL ER COATED BEADS 240 MG PO CP24
240.0000 mg | ORAL_CAPSULE | Freq: Every day | ORAL | 1 refills | Status: DC
Start: 2023-07-19 — End: 2023-12-25

## 2023-07-19 NOTE — Progress Notes (Signed)
Established Patient Office Visit  Subjective:  Patient ID: Savannah Martin, female    DOB: Dec 28, 1980  Age: 42 y.o. MRN: [id number]  CC:  Chief Complaint  Patient presents with   Medical Management of Chronic Issues    HPI Savannah Martin is a 42 y.o. female  has a past medical history of Anxiety, Chronic midline low back pain (10/18/2022), CKD (chronic kidney disease), CKD (chronic kidney disease), stage III (HCC), Hyperparathyroidism (HCC), Hypertension, LGSIL on Pap smear of cervix (2015), and Proteinuria.  Patient presents for follow-up for her chronic medical condition   Hypertension .currently on carvedilol 25 mg twice daily, furosemide 40 mg daily, spironolactone 50 mg daily, clonidine 0.2 mg twice daily, Cardizem 240 mg daily .patient stated that she has been out of carvedilol and diltiazem ,stated that she has been following up regularly with the nephrologist at [facility], she saw the cardiology once has upcoming appointment in December.  She recently had a adrenal veinous sampling for primary aldosteronism stated that Aldactone was discontinued prior to the test for about a month but she has resumed Aldactone since last week Friday. patient currently denies chest pain, shortness of breath edema.  States that her blood pressure has been elevated at home but better than what it is today before running out of her medications.  Chronic low back pain patient complains of chronic low back pain, that has been worse recently with standing, has aching pain rated 8/10.  Takes Flexeril as needed, has used gabapentin in the past.  No complaints of numbness, tingling.  Tobacco use disorder .now smokes 7 cigarettes daily was previously smoking 10 cigarettes daily.  Has Chantix ordered but she never took the medication.  Patient requested for referral to OB/GYN for cervical cancer screening, referral placed    Past Medical History:  Diagnosis Date   Anxiety    Chronic  midline low back pain 10/18/2022   CKD (chronic kidney disease)    CKD (chronic kidney disease), stage III (HCC)    Hyperparathyroidism (HCC)    Hypertension    LGSIL on Pap smear of cervix 2015   Proteinuria     History reviewed. No pertinent surgical history.  Family History  Problem Relation Age of Onset   Hypertension Mother    Stroke Mother 72   Anuerysm Mother    Hypertension Father    Epilepsy Sister    Bipolar disorder Sister    Cancer - Cervical Sister    Stroke Maternal Grandmother    Breast cancer Neg Hx    Lung cancer Neg Hx     Social History   Socioeconomic History   Marital status: Single    Spouse name: Not on file   Number of children: 4   Years of education: Not on file   Highest education level: Not on file  Occupational History   Not on file  Tobacco Use   Smoking status: Every Day    Current packs/day: 0.50    Types: Cigarettes   Smokeless tobacco: Never   Tobacco comments:    Started smoking cigarettes at age 71, smokes 0.5 [pack daily.   Vaping Use   Vaping status: Never Used  Substance and Sexual Activity   Alcohol use: Yes    Comment: occasionally   Drug use: Yes    Types: Marijuana    Comment: occasionally   Sexual activity: Yes  Other Topics Concern   Not on file  Social History Narrative   Lives with her  children    Social Determinants of Health   Financial Resource Strain: Not on file  Food Insecurity: Not on file  Transportation Needs: Not on file  Physical Activity: Not on file  Stress: Not on file  Social Connections: Unknown (12/30/2021)   Received from [facility], [facility]   Social Network    Social Network: Not on file  Intimate Partner Violence: Unknown (12/03/2021)   Received from [facility], [facility]   HITS    Physically Hurt: Not on file    Insult or Talk Down To: Not on file    Threaten Physical Harm: Not on file    Scream or Curse: Not on file    Outpatient Medications Prior to Visit   Medication Sig Dispense Refill   albuterol (VENTOLIN HFA) 108 (90 Base) MCG/ACT inhaler Inhale 2 puffs into the lungs every 6 (six) hours as needed for wheezing or shortness of breath. 8 g 0   diclofenac Sodium (VOLTAREN) 1 % GEL Apply 4 g topically 4 (four) times daily. 50 g 0   senna (SENOKOT) 8.6 MG TABS tablet Take 2 tablets by mouth at bedtime as needed.     carvedilol (COREG) 25 MG tablet Take 1 tablet (25 mg total) by mouth 2 (two) times daily. 180 tablet 3   cloNIDine (CATAPRES) 0.2 MG tablet TAKE 1 TABLET (0.2 MG TOTAL) BY MOUTH 2 (TWO) TIMES DAILY. 60 tablet 0   cyclobenzaprine (FLEXERIL) 5 MG tablet TAKE 1 TABLET (5 MG TOTAL) BY MOUTH 3 (THREE) TIMES DAILY AS NEEDED FOR MUSCLE SPASMS. 30 tablet 1   diltiazem (CARDIZEM CD) 240 MG 24 hr capsule Take 1 capsule (240 mg total) by mouth daily. 90 capsule 1   furosemide (LASIX) 20 MG tablet TAKE 2 TABLETS (40 MG TOTAL) BY MOUTH DAILY. 60 tablet 2   potassium chloride (KLOR-CON) 10 MEQ tablet TAKE 1 TABLET (10 MEQ TOTAL) BY MOUTH DAILY. 60 tablet 1   sertraline (ZOLOFT) 100 MG tablet TAKE 1 TABLET (100 MG TOTAL) BY MOUTH DAILY. 30 tablet 3   spironolactone (ALDACTONE) 50 MG tablet Take 50 mg by mouth daily.     ALPRAZolam (XANAX) 0.5 MG tablet  (Patient not taking: Reported on 07/19/2023)     HYDROcodone-acetaminophen (NORCO/VICODIN) 5-325 MG tablet Take 1 tablet by mouth every 4 (four) hours as needed for moderate pain. (Patient not taking: Reported on 07/19/2023) 20 tablet 0   Varenicline Tartrate, Starter, (CHANTIX STARTING MONTH PAK) 0.5 MG X 11 & 1 MG X 42 TBPK Take as instructed on packaging (Patient not taking: Reported on 07/19/2023) 1 each 0   No facility-administered medications prior to visit.    Allergies  Allergen Reactions   Latex Rash   Sulfa Antibiotics Rash   Penicillins Other (See Comments)   Sulfur Itching    ROS Review of Systems  Constitutional:  Negative for appetite change, chills, fatigue and fever.  HENT:   Negative for congestion, postnasal drip, rhinorrhea and sneezing.   Respiratory:  Negative for cough, shortness of breath and wheezing.   Cardiovascular:  Negative for chest pain, palpitations and leg swelling.  Gastrointestinal:  Negative for abdominal pain, constipation, nausea and vomiting.  Genitourinary:  Negative for difficulty urinating, dysuria, flank pain and frequency.  Musculoskeletal:  Negative for arthralgias, back pain, joint swelling and myalgias.  Skin:  Negative for color change, pallor, rash and wound.  Neurological:  Negative for dizziness, syncope, facial asymmetry, weakness and numbness.  Psychiatric/Behavioral:  Negative for behavioral problems, confusion,  self-injury and suicidal ideas.       Objective:    Physical Exam Vitals and nursing note reviewed.  Constitutional:      General: She is not in acute distress.    Appearance: Normal appearance. She is obese. She is not ill-appearing, toxic-appearing or diaphoretic.  HENT:     Mouth/Throat:     Mouth: Mucous membranes are moist.     Pharynx: Oropharynx is clear. No oropharyngeal exudate or posterior oropharyngeal erythema.  Eyes:     General: No scleral icterus.       Right eye: No discharge.        Left eye: No discharge.     Extraocular Movements: Extraocular movements intact.     Conjunctiva/sclera: Conjunctivae normal.  Cardiovascular:     Rate and Rhythm: Normal rate and regular rhythm.     Pulses: Normal pulses.     Heart sounds: Normal heart sounds. No murmur heard.    No friction rub. No gallop.  Pulmonary:     Effort: Pulmonary effort is normal. No respiratory distress.     Breath sounds: Normal breath sounds. No stridor. No wheezing, rhonchi or rales.  Chest:     Chest wall: No tenderness.  Abdominal:     General: There is no distension.     Palpations: Abdomen is soft.     Tenderness: There is no abdominal tenderness. There is no right CVA tenderness, left CVA tenderness or guarding.   Musculoskeletal:        General: Tenderness present. No swelling, deformity or signs of injury.     Right lower leg: No edema.     Left lower leg: No edema.     Comments: Tenderness on palpation of left side of low back.,  Skin warm and dry no redness or swelling noted.  Able to ambulate independently  Skin:    General: Skin is warm and dry.     Capillary Refill: Capillary refill takes less than 2 seconds.     Coloration: Skin is not jaundiced or pale.     Findings: No bruising, erythema or lesion.  Neurological:     Mental Status: She is alert and oriented to person, place, and time.     Motor: No weakness.     Coordination: Coordination normal.     Gait: Gait normal.  Psychiatric:        Mood and Affect: Mood normal.        Behavior: Behavior normal.        Thought Content: Thought content normal.        Judgment: Judgment normal.     BP (!) 187/127   Pulse 88   Temp (!) 97.2 F (36.2 C)   Wt 195 lb (88.5 kg)   SpO2 100%   BMI 35.67 kg/m  Wt Readings from Last 3 Encounters:  07/19/23 195 lb (88.5 kg)  02/09/23 188 lb 9.6 oz (85.5 kg)  01/18/23 185 lb (83.9 kg)    No results found for: "TSH" No results found for: "WBC", "HGB", "HCT", "MCV", "PLT" Lab Results  Component Value Date   NA 139 10/18/2022   K 3.7 10/18/2022   CO2 19 (L) 10/18/2022   GLUCOSE 91 10/18/2022   BUN 19 10/18/2022   CREATININE 1.50 (H) 10/18/2022   BILITOT 0.3 10/18/2022   ALKPHOS 118 10/18/2022   AST 15 10/18/2022   ALT 15 10/18/2022   PROT 6.8 10/18/2022   ALBUMIN 3.8 (L) 10/18/2022   CALCIUM 10.0 10/18/2022  EGFR 44 (L) 10/18/2022   No results found for: "CHOL" No results found for: "HDL" No results found for: "LDLCALC" No results found for: "TRIG" No results found for: "CHOLHDL" Lab Results  Component Value Date   HGBA1C 5.3 10/18/2022      Assessment & Plan:   Problem List Items Addressed This Visit       Cardiovascular and Mediastinum   High blood pressure    BP  Readings from Last 3 Encounters:  07/19/23 (!) 187/127  02/09/23 (!) 152/86  01/18/23 (!) 170/118  HTN uncontrolled .  Patient is asymptomatic Continue current medications.  Encouraged to maintain close follow-up with cardiology and nephrology Discussed DASH diet and dietary sodium restrictions Continue to increase dietary efforts and exercise.  Medications below refilled - carvedilol (COREG) 25 MG tablet; Take 1 tablet (25 mg total) by mouth 2 (two) times daily.  Dispense: 180 tablet; Refill: 3 - cloNIDine (CATAPRES) 0.2 MG tablet; Take 1 tablet (0.2 mg total) by mouth 2 (two) times daily.  Dispense: 180 tablet; Refill: 1 - diltiazem (CARDIZEM CD) 240 MG 24 hr capsule; Take 1 capsule (240 mg total) by mouth daily.  Dispense: 90 capsule; Refill: 1 - spironolactone (ALDACTONE) 50 MG tablet; Take 1 tablet (50 mg total) by mouth daily.  Dispense: 90 tablet; Refill: 0 - furosemide (LASIX) 40 MG tablet; Take 1 tablet (40 mg total) by mouth daily.  Dispense: 90 tablet; Refill: 0 - CMP14+EGFR Patient left the office in a stable condition  encouraged to pick up prescription for the medications that she ran out of and start taking them as ordered      Relevant Medications   carvedilol (COREG) 25 MG tablet   cloNIDine (CATAPRES) 0.2 MG tablet   diltiazem (CARDIZEM CD) 240 MG 24 hr capsule   spironolactone (ALDACTONE) 50 MG tablet   furosemide (LASIX) 40 MG tablet   Other Relevant Orders   CMP14+EGFR     Endocrine   Primary aldosteronism (HCC)    Recently had Adrenal venous sampling  for Hyperaldosteronism  Stated she has upcoming appointment with nephrology and endocrinology to discuss the results        Genitourinary   CKD stage 3a, GFR 45-59 ml/min (HCC)    Encouraged to maintain close follow-up with nephrologist as [facility] Avoid NSAIDs and other nephrotoxic agents Checking CMP today         Other   Anxiety and depression    Zoloft 100 mg daily refilled       Relevant Medications   sertraline (ZOLOFT) 100 MG tablet   Chronic midline low back pain    Flexeril 5 mg 3 times daily as needed refilled Start gabapentin 100 mg 3 times daily Stretching exercises application of heat or ice encouraged Follow-up with orthopedics if no improvement      Relevant Medications   sertraline (ZOLOFT) 100 MG tablet   cyclobenzaprine (FLEXERIL) 5 MG tablet   gabapentin (NEURONTIN) 100 MG capsule   Tobacco use disorder - Primary    Smokes about 7 cigarettes /day  Asked about quitting: confirms that he/she currently smokes cigarettes Advise to quit smoking: Educated about QUITTING to reduce the risk of cancer, cardio and cerebrovascular disease. Assess willingness: Unwilling to quit at this time, but is working on cutting back. Assist with counseling and pharmacotherapy: Counseled for 5 minutes and literature provided. Arrange for follow up: follow up in 2 months and continue to offer help.        Meds  ordered this encounter  Medications   carvedilol (COREG) 25 MG tablet    Sig: Take 1 tablet (25 mg total) by mouth 2 (two) times daily.    Dispense:  180 tablet    Refill:  3   cloNIDine (CATAPRES) 0.2 MG tablet    Sig: Take 1 tablet (0.2 mg total) by mouth 2 (two) times daily.    Dispense:  180 tablet    Refill:  1   diltiazem (CARDIZEM CD) 240 MG 24 hr capsule    Sig: Take 1 capsule (240 mg total) by mouth daily.    Dispense:  90 capsule    Refill:  1   spironolactone (ALDACTONE) 50 MG tablet    Sig: Take 1 tablet (50 mg total) by mouth daily.    Dispense:  90 tablet    Refill:  0   furosemide (LASIX) 40 MG tablet    Sig: Take 1 tablet (40 mg total) by mouth daily.    Dispense:  90 tablet    Refill:  0   sertraline (ZOLOFT) 100 MG tablet    Sig: Take 1 tablet (100 mg total) by mouth daily.    Dispense:  90 tablet    Refill:  1   cyclobenzaprine (FLEXERIL) 5 MG tablet    Sig: Take 1 tablet (5 mg total) by mouth 3 (three) times daily as  needed for muscle spasms.    Dispense:  30 tablet    Refill:  1   gabapentin (NEURONTIN) 100 MG capsule    Sig: Take 1 capsule (100 mg total) by mouth 3 (three) times daily.    Dispense:  90 capsule    Refill:  3    Follow-up: Return in about 2 months (around 09/18/2023) for HTN back pain.    Donell Beers, FNP

## 2023-07-19 NOTE — Assessment & Plan Note (Signed)
Recently had Adrenal venous sampling  for Hyperaldosteronism  Stated she has upcoming appointment with nephrology and endocrinology to discuss the results

## 2023-07-19 NOTE — Assessment & Plan Note (Signed)
Flexeril 5 mg 3 times daily as needed refilled Start gabapentin 100 mg 3 times daily Stretching exercises application of heat or ice encouraged Follow-up with orthopedics if no improvement

## 2023-07-19 NOTE — Assessment & Plan Note (Signed)
Smokes about 7 cigarettes /day  Asked about quitting: confirms that he/she currently smokes cigarettes Advise to quit smoking: Educated about QUITTING to reduce the risk of cancer, cardio and cerebrovascular disease. Assess willingness: Unwilling to quit at this time, but is working on cutting back. Assist with counseling and pharmacotherapy: Counseled for 5 minutes and literature provided. Arrange for follow up: follow up in 2 months and continue to offer help.

## 2023-07-19 NOTE — Assessment & Plan Note (Signed)
Zoloft 100 mg daily refilled

## 2023-07-19 NOTE — Patient Instructions (Addendum)
Please schedule an appointment with the cardiology as discussed I encourage you to maintain close follow-up with your kidney specialist as discussed Please monitor your blood pressure at home daily and report blood pressure readings consistently greater than 140/90 to your kidney doctor  Please take Tylenol 650 mg every 6 hours as needed for pain Application of heat or ice stretching exercises is highly encouraged    1. Hypertension, unspecified type  - carvedilol (COREG) 25 MG tablet; Take 1 tablet (25 mg total) by mouth 2 (two) times daily.  Dispense: 180 tablet; Refill: 3 - cloNIDine (CATAPRES) 0.2 MG tablet; Take 1 tablet (0.2 mg total) by mouth 2 (two) times daily.  Dispense: 180 tablet; Refill: 1 - diltiazem (CARDIZEM CD) 240 MG 24 hr capsule; Take 1 capsule (240 mg total) by mouth daily.  Dispense: 90 capsule; Refill: 1 - spironolactone (ALDACTONE) 50 MG tablet; Take 1 tablet (50 mg total) by mouth daily.  Dispense: 90 tablet; Refill: 0 - furosemide (LASIX) 40 MG tablet; Take 1 tablet (40 mg total) by mouth daily.  Dispense: 90 tablet; Refill: 0 - CMP14+EGFR  2. Anxiety and depression  - sertraline (ZOLOFT) 100 MG tablet; Take 1 tablet (100 mg total) by mouth daily.  Dispense: 90 tablet; Refill: 1  3. Chronic midline low back pain, unspecified whether sciatica present  - cyclobenzaprine (FLEXERIL) 5 MG tablet; Take 1 tablet (5 mg total) by mouth 3 (three) times daily as needed for muscle spasms.  Dispense: 30 tablet; Refill: 1 - gabapentin (NEURONTIN) 100 MG capsule; Take 1 capsule (100 mg total) by mouth 3 (three) times daily.  Dispense: 90 capsule; Refill: 3  4. Tobacco use disorder

## 2023-07-19 NOTE — Assessment & Plan Note (Signed)
Encouraged to maintain close follow-up with nephrologist as [facility] Avoid NSAIDs and other nephrotoxic agents Checking CMP today

## 2023-07-19 NOTE — Assessment & Plan Note (Addendum)
BP Readings from Last 3 Encounters:  07/19/23 (!) 187/127  02/09/23 (!) 152/86  01/18/23 (!) 170/118  HTN uncontrolled .  Patient is asymptomatic Continue current medications.  Encouraged to maintain close follow-up with cardiology and nephrology Discussed DASH diet and dietary sodium restrictions Continue to increase dietary efforts and exercise.  Medications below refilled - carvedilol (COREG) 25 MG tablet; Take 1 tablet (25 mg total) by mouth 2 (two) times daily.  Dispense: 180 tablet; Refill: 3 - cloNIDine (CATAPRES) 0.2 MG tablet; Take 1 tablet (0.2 mg total) by mouth 2 (two) times daily.  Dispense: 180 tablet; Refill: 1 - diltiazem (CARDIZEM CD) 240 MG 24 hr capsule; Take 1 capsule (240 mg total) by mouth daily.  Dispense: 90 capsule; Refill: 1 - spironolactone (ALDACTONE) 50 MG tablet; Take 1 tablet (50 mg total) by mouth daily.  Dispense: 90 tablet; Refill: 0 - furosemide (LASIX) 40 MG tablet; Take 1 tablet (40 mg total) by mouth daily.  Dispense: 90 tablet; Refill: 0 - CMP14+EGFR Patient left the office in a stable condition  encouraged to pick up prescription for the medications that she ran out of and start taking them as ordered

## 2023-07-20 LAB — CMP14+EGFR
ALT: 9 [IU]/L (ref 0–32)
AST: 19 [IU]/L (ref 0–40)
Albumin: 3.8 g/dL — ABNORMAL LOW (ref 3.9–4.9)
Alkaline Phosphatase: 129 [IU]/L — ABNORMAL HIGH (ref 44–121)
BUN/Creatinine Ratio: 12 (ref 9–23)
BUN: 19 mg/dL (ref 6–24)
Bilirubin Total: 0.5 mg/dL (ref 0.0–1.2)
CO2: 20 mmol/L (ref 20–29)
Calcium: 10.3 mg/dL — ABNORMAL HIGH (ref 8.7–10.2)
Chloride: 105 mmol/L (ref 96–106)
Creatinine, Ser: 1.55 mg/dL — ABNORMAL HIGH (ref 0.57–1.00)
Globulin, Total: 3.4 g/dL (ref 1.5–4.5)
Glucose: 86 mg/dL (ref 70–99)
Potassium: 4 mmol/L (ref 3.5–5.2)
Sodium: 139 mmol/L (ref 134–144)
Total Protein: 7.2 g/dL (ref 6.0–8.5)
eGFR: 42 mL/min/{1.73_m2} — ABNORMAL LOW (ref >59)

## 2023-07-30 DIAGNOSIS — Z419 Encounter for procedure for purposes other than remedying health state, unspecified: Secondary | ICD-10-CM | POA: Diagnosis not present

## 2023-08-04 DIAGNOSIS — N1832 Chronic kidney disease, stage 3b: Secondary | ICD-10-CM | POA: Diagnosis not present

## 2023-08-04 DIAGNOSIS — E2609 Other primary hyperaldosteronism: Secondary | ICD-10-CM | POA: Diagnosis not present

## 2023-08-04 DIAGNOSIS — E876 Hypokalemia: Secondary | ICD-10-CM | POA: Diagnosis not present

## 2023-08-04 DIAGNOSIS — I129 Hypertensive chronic kidney disease with stage 1 through stage 4 chronic kidney disease, or unspecified chronic kidney disease: Secondary | ICD-10-CM | POA: Diagnosis not present

## 2023-08-04 DIAGNOSIS — E210 Primary hyperparathyroidism: Secondary | ICD-10-CM | POA: Diagnosis not present

## 2023-08-14 ENCOUNTER — Ambulatory Visit: Payer: Medicaid Other | Attending: Internal Medicine | Admitting: Internal Medicine

## 2023-08-14 NOTE — Progress Notes (Unsigned)
Cardiology Office Note:    Date:  08/14/2023   ID:  [name], DOB 1980/12/30, MRN [id number]  PCP:  Donell Beers, FNP   [facility] Providers Cardiologist:  Maisie Fus, MD     Referring MD: Donell Beers, FNP   No chief complaint on file. HTN  History of Present Illness:    Savannah Martin is a 42 y.o. female with a hx of CKD stage 3 GFR 44, Crt 1.5 ,  referral for HTN/elevated BNP. She has had CKD for ~ 2-3 years per patient.  BP at home elevated. Has had HTN since she had her first child, 19 years ago. She has 4 boys, all healthy She denies angina,  She reports sleeping with pillows. She states she has low energy. She notes she gets easily fatigued. Notes DOE. She's had edema. Can have PND Mother had hx of stroke.  Smoking, prescribed chantix  Past Medical History:  Diagnosis Date   Anxiety    Chronic midline low back pain 10/18/2022   CKD (chronic kidney disease)    CKD (chronic kidney disease), stage III (HCC)    Hyperparathyroidism (HCC)    Hypertension    LGSIL on Pap smear of cervix 2015   Proteinuria     No past surgical history on file.  Current Medications: No outpatient medications have been marked as taking for the 08/14/23 encounter (Appointment) with Maisie Fus, MD.     Allergies:   Latex, Sulfa antibiotics, Penicillins, and Sulfur   Social History   Socioeconomic History   Marital status: Single    Spouse name: Not on file   Number of children: 4   Years of education: Not on file   Highest education level: Not on file  Occupational History   Not on file  Tobacco Use   Smoking status: Every Day    Current packs/day: 0.50    Types: Cigarettes   Smokeless tobacco: Never   Tobacco comments:    Started smoking cigarettes at age 23, smokes 0.5 [pack daily.   Vaping Use   Vaping status: Never Used  Substance and Sexual Activity   Alcohol use: Yes    Comment: occasionally   Drug use: Yes    Types:  Marijuana    Comment: occasionally   Sexual activity: Yes  Other Topics Concern   Not on file  Social History Narrative   Lives with her children    Social Drivers of Corporate investment banker Strain: Not on file  Food Insecurity: Not on file  Transportation Needs: Not on file  Physical Activity: Not on file  Stress: Not on file  Social Connections: Unknown (12/30/2021)   [facility], [facility]   Social Network    Social Network: Not on file     Family History: The patient's family history includes Anuerysm in her mother; Bipolar disorder in her sister; Cancer - Cervical in her sister; Epilepsy in her sister; Hypertension in her father and mother; Stroke in her maternal grandmother; Stroke (age of onset: 94) in her mother. There is no history of Breast cancer or Lung cancer.  ROS:   Please see the history of present illness.     All other systems reviewed and are negative.  EKGs/Labs/Other Studies Reviewed:    The following studies were reviewed today:   TTE  EF 60-65% RV is normal Mild MR Mild PHTN 46 mmhg LA size 53 cc/m2   EKG:  EKG is  ordered today.  The ekg ordered today demonstrates   02/09/2023- NSR, diffuse TWI  Recent Labs: 11/25/2022: BNP 1,101.9 07/19/2023: ALT 9; BUN 19; Creatinine, Ser 1.55; Potassium 4.0; Sodium 139  Recent Lipid Panel No results found for: "CHOL", "TRIG", "HDL", "CHOLHDL", "VLDL", "LDLCALC", "LDLDIRECT"   Risk Assessment/Calculations:     Physical Exam:    VS:   There were no vitals filed for this visit.    Wt Readings from Last 3 Encounters:  07/19/23 195 lb (88.5 kg)  02/09/23 188 lb 9.6 oz (85.5 kg)  01/18/23 185 lb (83.9 kg)     GEN:  Well nourished, well developed in no acute distress HEENT: Normal NECK: mild JVD LYMPHATICS: No lymphadenopathy CARDIAC: RRR, no murmurs, rubs, gallops RESPIRATORY:  Clear to auscultation without rales, wheezing or rhonchi  ABDOMEN: Soft, non-tender,  non-distended MUSCULOSKELETAL:  trace edema BL; No deformity  SKIN: Warm and dry NEUROLOGIC:  Alert and oriented x 3 PSYCHIATRIC:  Normal affect   ASSESSMENT:    HTN/elevated BNP; in the setting CKD related to long-standing HTN. Has elevated LVEDP. Will increase her lasix today and change labetolol to coreg. Can consider hydralazine if Bps not well managed on this regimen. PLAN:    In order of problems listed above:   - change labetalol to coreg 25 mg BID - can continue spironolactone 50 mg daily unless Crt >2 or K >4 - can continue diltiazem 240 mg daily - continue clonidine 0.2 mg BID - increase lasix 20 mg daily to 40 mg daily - BMET 2 weeks - FU with an APP in 1 month - Follow up in 6 months     Medication Adjustments/Labs and Tests Ordered: Current medicines are reviewed at length with the patient today.  Concerns regarding medicines are outlined above.  No orders of the defined types were placed in this encounter.  No orders of the defined types were placed in this encounter.   There are no Patient Instructions on file for this visit.   Signed, Maisie Fus, MD  08/14/2023 3:37 PM    [facility]

## 2023-08-15 ENCOUNTER — Encounter: Payer: Self-pay | Admitting: Internal Medicine

## 2023-08-15 DIAGNOSIS — E2609 Other primary hyperaldosteronism: Secondary | ICD-10-CM | POA: Diagnosis not present

## 2023-08-18 ENCOUNTER — Other Ambulatory Visit: Payer: Self-pay | Admitting: Nurse Practitioner

## 2023-08-18 DIAGNOSIS — M545 Other chronic pain: Secondary | ICD-10-CM

## 2023-08-18 NOTE — Telephone Encounter (Signed)
Please advise KH 

## 2023-08-30 DIAGNOSIS — Z419 Encounter for procedure for purposes other than remedying health state, unspecified: Secondary | ICD-10-CM | POA: Diagnosis not present

## 2023-09-20 ENCOUNTER — Ambulatory Visit: Payer: Self-pay | Admitting: Nurse Practitioner

## 2023-09-30 DIAGNOSIS — Z419 Encounter for procedure for purposes other than remedying health state, unspecified: Secondary | ICD-10-CM | POA: Diagnosis not present

## 2023-10-10 DIAGNOSIS — E042 Nontoxic multinodular goiter: Secondary | ICD-10-CM | POA: Diagnosis not present

## 2023-10-10 DIAGNOSIS — E210 Primary hyperparathyroidism: Secondary | ICD-10-CM | POA: Diagnosis not present

## 2023-10-28 DIAGNOSIS — Z419 Encounter for procedure for purposes other than remedying health state, unspecified: Secondary | ICD-10-CM | POA: Diagnosis not present

## 2023-11-20 ENCOUNTER — Other Ambulatory Visit: Payer: Self-pay | Admitting: Nurse Practitioner

## 2023-11-20 DIAGNOSIS — I10 Essential (primary) hypertension: Secondary | ICD-10-CM

## 2023-11-28 DIAGNOSIS — E210 Primary hyperparathyroidism: Secondary | ICD-10-CM | POA: Diagnosis not present

## 2023-11-28 DIAGNOSIS — E042 Nontoxic multinodular goiter: Secondary | ICD-10-CM | POA: Diagnosis not present

## 2023-12-09 DIAGNOSIS — Z419 Encounter for procedure for purposes other than remedying health state, unspecified: Secondary | ICD-10-CM | POA: Diagnosis not present

## 2023-12-25 ENCOUNTER — Other Ambulatory Visit: Payer: Self-pay | Admitting: Nurse Practitioner

## 2023-12-25 ENCOUNTER — Ambulatory Visit: Payer: Self-pay

## 2023-12-25 ENCOUNTER — Encounter: Payer: Self-pay | Admitting: Nurse Practitioner

## 2023-12-25 ENCOUNTER — Ambulatory Visit (INDEPENDENT_AMBULATORY_CARE_PROVIDER_SITE_OTHER): Admitting: Nurse Practitioner

## 2023-12-25 VITALS — BP 155/92 | HR 80 | Temp 97.0°F | Wt 195.0 lb

## 2023-12-25 DIAGNOSIS — E212 Other hyperparathyroidism: Secondary | ICD-10-CM | POA: Diagnosis not present

## 2023-12-25 DIAGNOSIS — I10 Essential (primary) hypertension: Secondary | ICD-10-CM

## 2023-12-25 DIAGNOSIS — E785 Hyperlipidemia, unspecified: Secondary | ICD-10-CM | POA: Insufficient documentation

## 2023-12-25 DIAGNOSIS — G8929 Other chronic pain: Secondary | ICD-10-CM

## 2023-12-25 DIAGNOSIS — E875 Hyperkalemia: Secondary | ICD-10-CM | POA: Diagnosis not present

## 2023-12-25 DIAGNOSIS — K5900 Constipation, unspecified: Secondary | ICD-10-CM | POA: Diagnosis not present

## 2023-12-25 DIAGNOSIS — M545 Low back pain, unspecified: Secondary | ICD-10-CM | POA: Diagnosis not present

## 2023-12-25 DIAGNOSIS — F17200 Nicotine dependence, unspecified, uncomplicated: Secondary | ICD-10-CM

## 2023-12-25 DIAGNOSIS — N1831 Chronic kidney disease, stage 3a: Secondary | ICD-10-CM | POA: Diagnosis not present

## 2023-12-25 MED ORDER — SPIRONOLACTONE 50 MG PO TABS: 50.0000 mg | ORAL_TABLET | Freq: Every day | ORAL | 1 refills | Status: DC

## 2023-12-25 MED ORDER — DILTIAZEM HCL ER COATED BEADS 240 MG PO CP24: 240.0000 mg | ORAL_CAPSULE | Freq: Every day | ORAL | 1 refills | Status: AC

## 2023-12-25 MED ORDER — CYCLOBENZAPRINE HCL 5 MG PO TABS: 5.0000 mg | ORAL_TABLET | Freq: Three times a day (TID) | ORAL | 1 refills | Status: DC | PRN

## 2023-12-25 MED ORDER — METHYLPREDNISOLONE SODIUM SUCC 125 MG IJ SOLR
125.0000 mg | Freq: Once | INTRAMUSCULAR | Status: AC
  Administered 2023-12-25: 125 mg via INTRAMUSCULAR

## 2023-12-25 MED ORDER — METHYLPREDNISOLONE 4 MG PO TBPK: ORAL_TABLET | ORAL | 0 refills | Status: DC

## 2023-12-25 MED ORDER — KETOROLAC TROMETHAMINE 30 MG/ML IJ SOLN
30.0000 mg | Freq: Once | INTRAMUSCULAR | Status: AC
  Administered 2023-12-25: 30 mg via INTRAMUSCULAR

## 2023-12-25 NOTE — Progress Notes (Signed)
 Acute Office Visit  Subjective:     Patient ID: Savannah Martin, female    DOB: 1981/08/13, 43 y.o.   MRN: [id number]  Chief Complaint  Patient presents with   Constipation    HPI Ms. Stallcup  has a past medical history of Anxiety, Chronic midline low back pain (10/18/2022), CKD (chronic kidney disease), CKD (chronic kidney disease), stage III (HCC), Hyperparathyroidism (HCC), Hypertension, LGSIL on Pap smear of cervix (2015), and Proteinuria.  Patient presents with complaints of left-sided low back pain and constipation  Left-sided low back pain.  Patient complains of left-sided low back pain for the past 2 months, states that her pain is constant and currently rated 8/10, changing position aggravates her pain, takes Tylenol  and gabapentin  as ordered  Chronic constipation.  Patient complains of chronic constipation.  Has been using stool softener and laxatives as needed without relief.  She denies abdominal pain, nausea, vomiting, bloody stool  Tobacco use disorder, smokes 6 to 8 cigarettes daily.  She denies wheezing, shortness of breath, cough   Hypertension.  Currently on carvedilol  25 mg twice daily, clonidine  0.2 mg twice daily, diltiazem  240 mg daily, furosemide  40 mg daily, spironolactone  50 mg daily.  Stated that she has been out of diltiazem  and spironolactone .  She is followed by nephrologist.  She denies chest pain, shortness of breath, edema   Review of Systems  Constitutional:  Negative for appetite change, chills, fatigue and fever.  HENT:  Negative for congestion, postnasal drip, rhinorrhea and sneezing.   Respiratory:  Negative for cough, shortness of breath and wheezing.   Cardiovascular:  Negative for chest pain, palpitations and leg swelling.  Gastrointestinal:  Positive for constipation. Negative for abdominal pain, nausea and vomiting.  Genitourinary:  Negative for difficulty urinating, dysuria, flank pain and frequency.  Musculoskeletal:  Positive for back  pain. Negative for joint swelling and myalgias.  Skin:  Negative for color change, pallor, rash and wound.  Neurological:  Negative for dizziness, facial asymmetry, weakness, numbness and headaches.  Psychiatric/Behavioral:  Negative for behavioral problems, confusion, self-injury and suicidal ideas.         Objective:    BP (!) 155/92   Pulse 80   Temp (!) 97 F (36.1 C)   Wt 195 lb (88.5 kg)   SpO2 100%   BMI 35.67 kg/m    Physical Exam Vitals and nursing note reviewed.  Constitutional:      General: She is not in acute distress.    Appearance: Normal appearance. She is obese. She is not ill-appearing, toxic-appearing or diaphoretic.  Eyes:     General: No scleral icterus.       Right eye: No discharge.        Left eye: No discharge.     Extraocular Movements: Extraocular movements intact.     Conjunctiva/sclera: Conjunctivae normal.  Cardiovascular:     Rate and Rhythm: Normal rate and regular rhythm.     Pulses: Normal pulses.     Heart sounds: Normal heart sounds. No murmur heard.    No friction rub. No gallop.  Pulmonary:     Effort: Pulmonary effort is normal. No respiratory distress.     Breath sounds: Normal breath sounds. No stridor. No wheezing, rhonchi or rales.  Chest:     Chest wall: No tenderness.  Abdominal:     General: There is no distension.     Palpations: Abdomen is soft.     Tenderness: There is no abdominal tenderness. There is no  right CVA tenderness, left CVA tenderness or guarding.  Musculoskeletal:        General: Tenderness present. No swelling, deformity or signs of injury.     Right lower leg: No edema.     Left lower leg: No edema.     Comments: Tenderness on palpation of left side of low back area, no redness or swelling noted.  Patient ambulating independently  Skin:    General: Skin is warm and dry.     Capillary Refill: Capillary refill takes less than 2 seconds.     Coloration: Skin is not jaundiced or pale.     Findings: No  bruising, erythema or lesion.  Neurological:     Mental Status: She is alert and oriented to person, place, and time.     Motor: No weakness.     Coordination: Coordination normal.     Gait: Gait normal.  Psychiatric:        Mood and Affect: Mood normal.        Behavior: Behavior normal.        Thought Content: Thought content normal.        Judgment: Judgment normal.     No results found for any visits on 12/25/23.      Assessment & Plan:   Problem List Items Addressed This Visit       Cardiovascular and Mediastinum   High blood pressure   Spironolactone  50 mg daily and diltiazem  240 mg daily refilled, continue carvedilol  25 mg twice daily, clonidine  0.2 mg twice daily, furosemide  40 mg daily, spironolactone  and diltiazem  DASH diet and commitment to daily physical activity for a minimum of 30 minutes discussed and encouraged, as a part of hypertension management. Encouraged to maintain close follow-up with nephrologist     12/25/2023    2:30 PM 12/25/2023    2:27 PM 07/19/2023    2:28 PM 07/19/2023    2:07 PM 07/19/2023    2:04 PM 02/09/2023    1:28 PM 01/18/2023    1:58 PM  BP/Weight  Systolic BP 155 165 187 186 196 152 170  Diastolic BP 92 91 127 102 127 86 118  Wt. (Lbs)  195   195 188.6   BMI  35.67 kg/m2   35.67 kg/m2 34.5 kg/m2            Relevant Medications   hydrALAZINE  (APRESOLINE ) 50 MG tablet   spironolactone  (ALDACTONE ) 50 MG tablet   diltiazem  (CARDIZEM  CD) 240 MG 24 hr capsule   Other Relevant Orders   Basic Metabolic Panel     Endocrine   Other hyperparathyroidism (HCC)   Has upcoming parathyroidectomy  surgery        Genitourinary   CKD stage 3a, GFR 45-59 ml/min (HCC)   . Component Ref Range & Units 2 mo ago  Sodium 135 - 145 mmol/L 138  Potassium 3.4 - 4.8 mmol/L 5.7 High   Chloride 98 - 107 mmol/L 109 High   CO2 20.0 - 31.0 mmol/L 21.8  Anion Gap 5 - 14 mmol/L 7  BUN 9 - 23 mg/dL 19  Creatinine 9.60 - 4.54 mg/dL 0.98 High    BUN/Creatinine Ratio 14  eGFR CKD-EPI (2021) Female >=60 mL/min/1.45m2 48 Low   Comment: eGFR calculated with CKD-EPI 2021 equation in accordance with [employer] and [employer] of Nephrology Task Force recommendations.  Glucose 70 - 179 mg/dL 119  Calcium 8.7 - 14.7 mg/dL 82.9  Avoid NSAIDs and other nephrotoxic agent Regarding 60 fosses  of water daily to maintain hydration        Other   Chronic midline low back pain   Relevant Medications   methylPREDNISolone (MEDROL DOSEPAK) 4 MG TBPK tablet (Start on 12/26/2023)   cyclobenzaprine  (FLEXERIL ) 5 MG tablet   Tobacco use disorder   Smokes about 6-8 pack/day  Asked about quitting: confirms that he/she currently smokes cigarettes Advise to quit smoking: Educated about QUITTING to reduce the risk of cancer, cardio and cerebrovascular disease. Assess willingness: Unwilling to quit at this time, but is working on cutting back. Assist with counseling and pharmacotherapy: Counseled for 5 minutes and literature provided. Arrange for follow up: follow up in 2 months and continue to offer help.       Constipation   Encourage to maintain hydration and increase intake of fiber Continue stool softener and laxative Patient referred to GI      Relevant Orders   Ambulatory referral to Gastroenterology   Chronic left-sided low back pain without sciatica - Primary     From tomorrow start methylPREDNISolone (MEDROL DOSEPAK) 4 MG TBPK tablet; Take with food and as instructed on the packaging  Dispense: 1 each; Refill: 0 - DG Lumbar Spine 2-3 Views; Future - ketorolac (TORADOL) 30 MG/ML injection 30 mg - methylPREDNISolone sodium succinate (SOLU-MEDROL) 125 mg/2 mL injection 125 mg Use of heating pad, Tylenol  650 mg every 6 hours stretching exercises encouraged Continue Flexeril  5 mg 3 times daily as needed      Relevant Medications   methylPREDNISolone (MEDROL DOSEPAK) 4 MG TBPK tablet (Start on 12/26/2023)    cyclobenzaprine  (FLEXERIL ) 5 MG tablet   Other Relevant Orders   DG Lumbar Spine 2-3 Views   Hyperkalemia   Component Ref Range & Units 2 mo ago  Sodium 135 - 145 mmol/L 138  Potassium 3.4 - 4.8 mmol/L 5.7 High   Chloride 98 - 107 mmol/L 109 High   CO2 20.0 - 31.0 mmol/L 21.8  Anion Gap 5 - 14 mmol/L 7  BUN 9 - 23 mg/dL 19  Creatinine 1.61 - 0.96 mg/dL 0.45 High   BUN/Creatinine Ratio 14  eGFR CKD-EPI (2021) Female >=60 mL/min/1.19m2 48 Low   Comment: eGFR calculated with CKD-EPI 2021 equation in accordance with [employer] and [employer] of Nephrology Task Force recommendations.  Glucose 70 - 179 mg/dL 409  Calcium 8.7 - 81.1 mg/dL 91.4  Rechecking labs       Meds ordered this encounter  Medications   spironolactone  (ALDACTONE ) 50 MG tablet    Sig: Take 1 tablet (50 mg total) by mouth daily.    Dispense:  90 tablet    Refill:  1   diltiazem  (CARDIZEM  CD) 240 MG 24 hr capsule    Sig: Take 1 capsule (240 mg total) by mouth daily.    Dispense:  90 capsule    Refill:  1   methylPREDNISolone (MEDROL DOSEPAK) 4 MG TBPK tablet    Sig: Take with food and as instructed on the packaging    Dispense:  1 each    Refill:  0   ketorolac (TORADOL) 30 MG/ML injection 30 mg   methylPREDNISolone sodium succinate (SOLU-MEDROL) 125 mg/2 mL injection 125 mg   cyclobenzaprine  (FLEXERIL ) 5 MG tablet    Sig: Take 1 tablet (5 mg total) by mouth 3 (three) times daily as needed for muscle spasms.    Dispense:  30 tablet    Refill:  1    Return in about 4 weeks (around 01/22/2024) for  HTN .  Alexys Gassett R Bensyn Bornemann, FNP

## 2023-12-25 NOTE — Assessment & Plan Note (Addendum)
 Has upcoming parathyroidectomy  surgery

## 2023-12-25 NOTE — Assessment & Plan Note (Addendum)
   From tomorrow start methylPREDNISolone (MEDROL DOSEPAK) 4 MG TBPK tablet; Take with food and as instructed on the packaging  Dispense: 1 each; Refill: 0 - DG Lumbar Spine 2-3 Views; Future - ketorolac (TORADOL) 30 MG/ML injection 30 mg - methylPREDNISolone sodium succinate (SOLU-MEDROL) 125 mg/2 mL injection 125 mg Use of heating pad, Tylenol  650 mg every 6 hours stretching exercises encouraged Continue Flexeril  5 mg 3 times daily as needed

## 2023-12-25 NOTE — Assessment & Plan Note (Signed)
 Smokes about 6-8 pack/day  Asked about quitting: confirms that he/she currently smokes cigarettes Advise to quit smoking: Educated about QUITTING to reduce the risk of cancer, cardio and cerebrovascular disease. Assess willingness: Unwilling to quit at this time, but is working on cutting back. Assist with counseling and pharmacotherapy: Counseled for 5 minutes and literature provided. Arrange for follow up: follow up in 2 months and continue to offer help.

## 2023-12-25 NOTE — Assessment & Plan Note (Signed)
.   Component Ref Range & Units 2 mo ago  Sodium 135 - 145 mmol/L 138  Potassium 3.4 - 4.8 mmol/L 5.7 High   Chloride 98 - 107 mmol/L 109 High   CO2 20.0 - 31.0 mmol/L 21.8  Anion Gap 5 - 14 mmol/L 7  BUN 9 - 23 mg/dL 19  Creatinine 0.98 - 1.19 mg/dL 1.47 High   BUN/Creatinine Ratio 14  eGFR CKD-EPI (2021) Female >=60 mL/min/1.61m2 48 Low   Comment: eGFR calculated with CKD-EPI 2021 equation in accordance with [employer] and [employer] of Nephrology Task Force recommendations.  Glucose 70 - 179 mg/dL 829  Calcium 8.7 - 56.2 mg/dL 13.0  Avoid NSAIDs and other nephrotoxic agent Regarding 60 fosses of water daily to maintain hydration

## 2023-12-25 NOTE — Assessment & Plan Note (Signed)
 Encourage to maintain hydration and increase intake of fiber Continue stool softener and laxative Patient referred to GI

## 2023-12-25 NOTE — Assessment & Plan Note (Signed)
 Component Ref Range & Units 2 mo ago  Sodium 135 - 145 mmol/L 138  Potassium 3.4 - 4.8 mmol/L 5.7 High   Chloride 98 - 107 mmol/L 109 High   CO2 20.0 - 31.0 mmol/L 21.8  Anion Gap 5 - 14 mmol/L 7  BUN 9 - 23 mg/dL 19  Creatinine 1.61 - 0.96 mg/dL 0.45 High   BUN/Creatinine Ratio 14  eGFR CKD-EPI (2021) Female >=60 mL/min/1.66m2 48 Low   Comment: eGFR calculated with CKD-EPI 2021 equation in accordance with [employer] and [employer] of Nephrology Task Force recommendations.  Glucose 70 - 179 mg/dL 409  Calcium 8.7 - 81.1 mg/dL 91.4  Rechecking labs

## 2023-12-25 NOTE — Assessment & Plan Note (Signed)
 Spironolactone  50 mg daily and diltiazem  240 mg daily refilled, continue carvedilol  25 mg twice daily, clonidine  0.2 mg twice daily, furosemide  40 mg daily, spironolactone  and diltiazem  DASH diet and commitment to daily physical activity for a minimum of 30 minutes discussed and encouraged, as a part of hypertension management. Encouraged to maintain close follow-up with nephrologist     12/25/2023    2:30 PM 12/25/2023    2:27 PM 07/19/2023    2:28 PM 07/19/2023    2:07 PM 07/19/2023    2:04 PM 02/09/2023    1:28 PM 01/18/2023    1:58 PM  BP/Weight  Systolic BP 155 165 187 186 196 152 170  Diastolic BP 92 91 127 102 127 86 118  Wt. (Lbs)  195   195 188.6   BMI  35.67 kg/m2   35.67 kg/m2 34.5 kg/m2

## 2023-12-25 NOTE — Patient Instructions (Signed)
 You were given Toradol 30 mg injection and Solu-Medrol 125mg  injection in the office today Please start taking prednisone from tomorrow, take medication with food.  Okay to take Tylenol  650 mg every 6 hours as needed, I encourage use of heating pad and ice as needed.  Please get your x-ray done as discussed Over  It is important that you exercise regularly at least 30 minutes 5 times a week as tolerated  Think about what you will eat, plan ahead. Choose " clean, green, fresh or frozen" over canned, processed or packaged foods which are more sugary, salty and fatty. 70 to 75% of food eaten should be vegetables and fruit. Three meals at set times with snacks allowed between meals, but they must be fruit or vegetables. Aim to eat over a 12 hour period , example 7 am to 7 pm, and STOP after  your last meal of the day. Drink water,generally about 64 ounces per day, no other drink is as healthy. Fruit juice is best enjoyed in a healthy way, by EATING the fruit.  Thanks for choosing [facility] we consider it a privelige to serve you.

## 2023-12-25 NOTE — Telephone Encounter (Signed)
 Copied from CRM [id number]. Topic: Clinical - Red Word Triage >> Dec 25, 2023 12:00 PM [place] B wrote: [facility] that prompted transfer to Nurse Triage: pt has severe lower back and digestive issues    Chief Complaint: Constipation Symptoms: Bloating Frequency: on going Pertinent Negatives: Patient denies  Disposition: [] ED /[] Urgent Care (no appt availability in office) / [x] Appointment(In office/virtual)/ []  [place] [facility]/ [] Home Care/ [] Refused Recommended Disposition /[]  Mobile Bus/ []  Follow-up with PCP Additional Notes: Agrees with appointment.  Reason for Disposition  Unable to have a bowel movement (BM) without laxative or enema  Answer Assessment - Initial Assessment Questions 1. STOOL PATTERN OR FREQUENCY: "How often do you have a bowel movement (BM)?"  (Normal range: 3 times a day to every 3 days)  "When was your last BM?"       1 X week 2. STRAINING: "Do you have to strain to have a BM?"      Strain 3. RECTAL PAIN: "Does your rectum hurt when the stool comes out?" If Yes, ask: "Do you have hemorrhoids? How bad is the pain?"  (Scale 1-10; or mild, moderate, severe)     Yes 4. STOOL COMPOSITION: "Are the stools hard?"      Hard 5. BLOOD ON STOOLS: "Has there been any blood on the toilet tissue or on the surface of the BM?" If Yes, ask: "When was the last time?"     No 6. CHRONIC CONSTIPATION: "Is this a new problem for you?"  If No, ask: "How long have you had this problem?" (days, weeks, months)      No 7. CHANGES IN DIET OR HYDRATION: "Have there been any recent changes in your diet?" "How much fluids are you drinking on a daily basis?"  "How much have you had to drink today?"     No 8. MEDICINES: "Have you been taking any new medicines?" "Are you taking any narcotic pain medicines?" (e.g., Dilaudid, morphine, Percocet, Vicodin)     No 9. LAXATIVES: "Have you been using any stool softeners, laxatives, or enemas?"  If Yes, ask "What, how often, and when  was the last time?"     Yes 10. ACTIVITY:  "How much walking do you do every day?"  "Has your activity level decreased in the past week?"        No 11. CAUSE: "What do you think is causing the constipation?"        Unsure 12. OTHER SYMPTOMS: "Do you have any other symptoms?" (e.g., abdomen pain, bloating, fever, vomiting)       Bloating 13. MEDICAL HISTORY: "Do you have a history of hemorrhoids, rectal fissures, or rectal surgery or rectal abscess?"         Yes 14. PREGNANCY: "Is there any chance you are pregnant?" "When was your last menstrual period?"       No  Protocols used: Constipation-A-AH

## 2023-12-26 LAB — BASIC METABOLIC PANEL WITH GFR
BUN/Creatinine Ratio: 15 (ref 9–23)
BUN: 24 mg/dL (ref 6–24)
CO2: 17 mmol/L — ABNORMAL LOW (ref 20–29)
Calcium: 10.6 mg/dL — ABNORMAL HIGH (ref 8.7–10.2)
Chloride: 110 mmol/L — ABNORMAL HIGH (ref 96–106)
Creatinine, Ser: 1.64 mg/dL — ABNORMAL HIGH (ref 0.57–1.00)
Glucose: 97 mg/dL (ref 70–99)
Potassium: 5.2 mmol/L (ref 3.5–5.2)
Sodium: 140 mmol/L (ref 134–144)
eGFR: 39 mL/min/{1.73_m2} — ABNORMAL LOW (ref >59)

## 2024-01-08 DIAGNOSIS — Z419 Encounter for procedure for purposes other than remedying health state, unspecified: Secondary | ICD-10-CM | POA: Diagnosis not present

## 2024-01-26 DIAGNOSIS — E049 Nontoxic goiter, unspecified: Secondary | ICD-10-CM | POA: Diagnosis not present

## 2024-01-26 DIAGNOSIS — E042 Nontoxic multinodular goiter: Secondary | ICD-10-CM | POA: Diagnosis not present

## 2024-01-26 DIAGNOSIS — E210 Primary hyperparathyroidism: Secondary | ICD-10-CM | POA: Diagnosis not present

## 2024-01-31 ENCOUNTER — Ambulatory Visit: Admitting: Nurse Practitioner

## 2024-02-06 DIAGNOSIS — E042 Nontoxic multinodular goiter: Secondary | ICD-10-CM | POA: Diagnosis not present

## 2024-02-06 DIAGNOSIS — E210 Primary hyperparathyroidism: Secondary | ICD-10-CM | POA: Diagnosis not present

## 2024-02-08 DIAGNOSIS — Z419 Encounter for procedure for purposes other than remedying health state, unspecified: Secondary | ICD-10-CM | POA: Diagnosis not present

## 2024-03-04 ENCOUNTER — Emergency Department (HOSPITAL_COMMUNITY)
Admission: EM | Admit: 2024-03-04 | Discharge: 2024-03-04 | Discharge status: Against Medical Advice | Attending: Emergency Medicine | Admitting: Emergency Medicine

## 2024-03-04 ENCOUNTER — Other Ambulatory Visit: Payer: Self-pay

## 2024-03-04 DIAGNOSIS — Z5321 Procedure and treatment not carried out due to patient leaving prior to being seen by health care provider: Secondary | ICD-10-CM | POA: Insufficient documentation

## 2024-03-04 DIAGNOSIS — W01198A Fall on same level from slipping, tripping and stumbling with subsequent striking against other object, initial encounter: Secondary | ICD-10-CM | POA: Insufficient documentation

## 2024-03-04 DIAGNOSIS — M79601 Pain in right arm: Secondary | ICD-10-CM | POA: Insufficient documentation

## 2024-03-04 NOTE — ED Triage Notes (Signed)
 Patient to ED by POV with c/o fall and right arm pain. States she did hit her head but did not LOC and no thinners. Denies being able to move arm.

## 2024-03-05 ENCOUNTER — Encounter (HOSPITAL_COMMUNITY): Payer: Self-pay | Admitting: *Deleted

## 2024-03-05 ENCOUNTER — Emergency Department (HOSPITAL_COMMUNITY)
Admission: EM | Admit: 2024-03-05 | Discharge: 2024-03-05 | Disposition: A | Discharge status: Home / Self Care | Attending: Emergency Medicine | Admitting: Emergency Medicine

## 2024-03-05 ENCOUNTER — Other Ambulatory Visit: Payer: Self-pay

## 2024-03-05 ENCOUNTER — Emergency Department (HOSPITAL_COMMUNITY)

## 2024-03-05 DIAGNOSIS — S52501A Unspecified fracture of the lower end of right radius, initial encounter for closed fracture: Secondary | ICD-10-CM | POA: Diagnosis not present

## 2024-03-05 DIAGNOSIS — Z91040 Latex allergy status: Secondary | ICD-10-CM | POA: Insufficient documentation

## 2024-03-05 DIAGNOSIS — N183 Chronic kidney disease, stage 3 unspecified: Secondary | ICD-10-CM | POA: Insufficient documentation

## 2024-03-05 DIAGNOSIS — W19XXXA Unspecified fall, initial encounter: Secondary | ICD-10-CM | POA: Insufficient documentation

## 2024-03-05 DIAGNOSIS — S52601A Unspecified fracture of lower end of right ulna, initial encounter for closed fracture: Secondary | ICD-10-CM | POA: Diagnosis not present

## 2024-03-05 DIAGNOSIS — S52691A Other fracture of lower end of right ulna, initial encounter for closed fracture: Secondary | ICD-10-CM | POA: Diagnosis not present

## 2024-03-05 DIAGNOSIS — S322XXA Fracture of coccyx, initial encounter for closed fracture: Secondary | ICD-10-CM | POA: Insufficient documentation

## 2024-03-05 DIAGNOSIS — M25531 Pain in right wrist: Secondary | ICD-10-CM | POA: Diagnosis present

## 2024-03-05 DIAGNOSIS — I129 Hypertensive chronic kidney disease with stage 1 through stage 4 chronic kidney disease, or unspecified chronic kidney disease: Secondary | ICD-10-CM | POA: Insufficient documentation

## 2024-03-05 DIAGNOSIS — S3210XA Unspecified fracture of sacrum, initial encounter for closed fracture: Secondary | ICD-10-CM | POA: Diagnosis not present

## 2024-03-05 LAB — POC URINE PREG, ED: Preg Test, Ur: NEGATIVE

## 2024-03-05 MED ORDER — IBUPROFEN 800 MG PO TABS
800.0000 mg | ORAL_TABLET | Freq: Once | ORAL | Status: AC
  Administered 2024-03-05: 800 mg via ORAL
  Filled 2024-03-05: qty 1

## 2024-03-05 MED ORDER — OXYCODONE-ACETAMINOPHEN 5-325 MG PO TABS: 1.0000 | ORAL_TABLET | Freq: Four times a day (QID) | ORAL | 0 refills | Status: AC | PRN

## 2024-03-05 NOTE — ED Triage Notes (Signed)
 Hee by POV from home for R wrist pain s/p fall on Friday. Described as Foosh after falling backwards, also pain to R buttocks. Alert, NAD, calm, interactive. Wrapped with coban on arrival. CMS, ROM, skin intact. Pulses strong. Compartments soft. R hand dominant.

## 2024-03-05 NOTE — Progress Notes (Signed)
 Orthopedic Tech Progress Note Patient Details:  Savannah Martin 1980/12/24 [id number]  Ortho Devices Type of Ortho Device: Sugartong splint, Sling immobilizer, Ace wrap, Cotton web roll Ortho Device/Splint Location: right sugartong applied. right sling applied Ortho Device/Splint Interventions: Ordered, Application, Adjustment   Post Interventions Patient Tolerated: Well Instructions Provided: Adjustment of device, Care of device  Waylan Thom Loving 03/05/2024, 1:24 PM

## 2024-03-05 NOTE — ED Provider Notes (Signed)
 [place] EMERGENCY DEPARTMENT AT [facility] Provider Note   CSN: [id number] Arrival date & time: 03/05/24  9045     Patient presents with: Wrist Pain and Fall   Savannah Martin is a 43 y.o. female.    Wrist Pain  Fall     43 year old female with medical history significant for HTN, CKD stage III, chronic midline low back pain presenting to the emergency department after a fall with wrist pain and tailbone pain.  The patient states that she lost her balance and sustained a FOOSH mechanism falling backwards landing on her right wrist.  She is right-handed.  She has had pain in the right volar aspect of her wrist, denies any logic deficit, pain is sharp, denies any deformity.  She also complains of pain in her tailbone which has been present as well since the fall.  She had no difficulty ambulating or with range of motion of her joints. No head trauma. Arrives GCS 15, ABC intact.  Prior to Admission medications   Medication Sig Start Date End Date Taking? Authorizing Provider  oxyCODONE -acetaminophen  (PERCOCET/ROXICET) 5-325 MG tablet Take 1 tablet by mouth every 6 (six) hours as needed for severe pain (pain score 7-10). 03/05/24  Yes Jerrol Agent, MD  albuterol  (VENTOLIN  HFA) 108 (90 Base) MCG/ACT inhaler Inhale 2 puffs into the lungs every 6 (six) hours as needed for wheezing or shortness of breath. 11/25/22   Paseda, Folashade R, FNP  ALPRAZolam (XANAX) 0.5 MG tablet  03/27/19   [provider]  carvedilol  (COREG ) 25 MG tablet Take 1 tablet (25 mg total) by mouth 2 (two) times daily. 07/19/23 12/25/23  Paseda, Folashade R, FNP  cloNIDine  (CATAPRES ) 0.2 MG tablet Take 1 tablet (0.2 mg total) by mouth 2 (two) times daily. 07/19/23   Paseda, Folashade R, FNP  cyclobenzaprine  (FLEXERIL ) 5 MG tablet Take 1 tablet (5 mg total) by mouth 3 (three) times daily as needed for muscle spasms. 12/25/23   Paseda, Folashade R, FNP  diclofenac  Sodium (VOLTAREN ) 1 % GEL Apply 4 g  topically 4 (four) times daily. 11/25/22   Paseda, Folashade R, FNP  diltiazem  (CARDIZEM  CD) 240 MG 24 hr capsule Take 1 capsule (240 mg total) by mouth daily. 12/25/23 03/24/24  Paseda, Folashade R, FNP  furosemide  (LASIX ) 40 MG tablet TAKE 1 TABLET (40 MG TOTAL) BY MOUTH DAILY. 11/20/23   Paseda, Folashade R, FNP  gabapentin  (NEURONTIN ) 100 MG capsule TAKE 1 CAPSULE (100 MG TOTAL) BY MOUTH 3 (THREE) TIMES DAILY. 12/25/23   Oley Bascom RAMAN, NP  hydrALAZINE  (APRESOLINE ) 50 MG tablet Take 50 mg by mouth 3 (three) times daily. Patient not taking: Reported on 12/25/2023 08/08/23   [provider]  methylPREDNISolone  (MEDROL  DOSEPAK) 4 MG TBPK tablet Take with food and as instructed on the packaging 12/26/23   Paseda, Folashade R, FNP  senna (SENOKOT) 8.6 MG TABS tablet Take 2 tablets by mouth at bedtime as needed.    [provider]  sertraline  (ZOLOFT ) 100 MG tablet Take 1 tablet (100 mg total) by mouth daily. 07/19/23   Paseda, Folashade R, FNP  spironolactone  (ALDACTONE ) 50 MG tablet Take 1 tablet (50 mg total) by mouth daily. 12/25/23   Paseda, Folashade R, FNP  Varenicline  Tartrate, Starter, (CHANTIX  STARTING MONTH PAK) 0.5 MG X 11 & 1 MG X 42 TBPK Take as instructed on packaging Patient not taking: Reported on 12/25/2023 11/25/22   Paseda, Folashade R, FNP    Allergies: Latex, Sulfa antibiotics, Penicillins, and Sulfur  Review of Systems  All other systems reviewed and are negative.   Updated Vital Signs BP (!) 166/9 (BP Location: Left Arm)   Pulse 87   Temp 98 F (36.7 C) (Oral)   Resp 18   Ht 5' 3 (1.6 m)   Wt 87.1 kg   LMP 03/02/2024 (Exact Date)   SpO2 100%   BMI 34.01 kg/m   Physical Exam Vitals and nursing note reviewed.  Constitutional:      General: She is not in acute distress. HENT:     Head: Normocephalic and atraumatic.  Eyes:     Conjunctiva/sclera: Conjunctivae normal.     Pupils: Pupils are equal, round, and reactive to light.  Cardiovascular:      Rate and Rhythm: Normal rate and regular rhythm.  Pulmonary:     Effort: Pulmonary effort is normal. No respiratory distress.  Abdominal:     General: There is no distension.     Tenderness: There is no guarding.  Musculoskeletal:        General: Tenderness and signs of injury present. No deformity.     Cervical back: Neck supple.     Comments: Right hand neurovascular intact with 2+ radial pulses, intact motor function along the median, ulnar, radial nerve distributions, tenderness about the volar aspect of the right wrist, compartments soft.  No midline tenderness of the cervical, thoracic or lumbar spine, tenderness about the sacrum/coccyx.  Extremities otherwise atraumatic with intact range of motion.  Skin:    Findings: No lesion or rash.  Neurological:     General: No focal deficit present.     Mental Status: She is alert. Mental status is at baseline.     (all labs ordered are listed, but only abnormal results are displayed) Labs Reviewed  POC URINE PREG, ED    EKG: None  Radiology: DG Sacrum/Coccyx Result Date: 03/05/2024 CLINICAL DATA:  Status post fall with 4 days of left hip and tailbone pain EXAM: SACRUM AND COCCYX - 3 VIEW; PELVIS - 1 VIEW COMPARISON:  None Available. FINDINGS: Displaced sacrococcygeal spine fracture at the inferior aspect of S5 with 50% anterior displacement. No acute displaced pelvic or hip fracture or diastasis. IMPRESSION: Displaced sacrococcygeal spine fracture at the inferior aspect of S5. Electronically Signed   By: Limin  Xu M.D.   On: 03/05/2024 12:53   DG Pelvis 1-2 Views Result Date: 03/05/2024 CLINICAL DATA:  Status post fall with 4 days of left hip and tailbone pain EXAM: SACRUM AND COCCYX - 3 VIEW; PELVIS - 1 VIEW COMPARISON:  None Available. FINDINGS: Displaced sacrococcygeal spine fracture at the inferior aspect of S5 with 50% anterior displacement. No acute displaced pelvic or hip fracture or diastasis. IMPRESSION: Displaced  sacrococcygeal spine fracture at the inferior aspect of S5. Electronically Signed   By: Limin  Xu M.D.   On: 03/05/2024 12:53   DG Wrist Complete Right Result Date: 03/05/2024 CLINICAL DATA:  fall, FOOSH, pain EXAM: RIGHT WRIST - COMPLETE 3+ VIEW COMPARISON:  None Available. FINDINGS: Minimally displaced, obliquely oriented fracture through the distal ulnar metadiaphysis. No dislocation. There is no evidence of arthropathy or other focal bone abnormality. Soft tissues are unremarkable. IMPRESSION: Minimally displaced, obliquely oriented fracture through the distal ulnar metadiaphysis. Electronically Signed   By: Rogelia Myers M.D.   On: 03/05/2024 12:46     Procedures   Medications Ordered in the ED  ibuprofen  (ADVIL ) tablet 800 mg (800 mg Oral Given 03/05/24 1032)  Medical Decision Making Amount and/or Complexity of Data Reviewed Labs: ordered. Radiology: ordered.  Risk Prescription drug management.    43 year old female with medical history significant for HTN, CKD stage III, chronic midline low back pain presenting to the emergency department after a fall with wrist pain and tailbone pain.  The patient states that she lost her balance and sustained a FOOSH mechanism falling backwards landing on her right wrist.  She is right-handed.  She has had pain in the right volar aspect of her wrist, denies any logic deficit, pain is sharp, denies any deformity.  She also complains of pain in her tailbone which has been present as well since the fall.  She had no difficulty ambulating or with range of motion of her joints. No head trauma. Arrives GCS 15, ABC intact.  On arrival, the patient was vitally stable.  Tenderness about the right wrist, neurovascularly intact, some tenderness about the sacrum/coccyx.  Will obtain x-ray imaging and reassess.  XR Right Wrist:  IMPRESSION:  Minimally displaced, obliquely oriented fracture through the distal  ulnar  metadiaphysis.   XR Sacrum/Coccyx:  IMPRESSION:  Displaced sacrococcygeal spine fracture at the inferior aspect of  S5.   XR Pelvis:  IMPRESSION:  Displaced sacrococcygeal spine fracture at the inferior aspect of  S5.    Patient was placed in an ulnar gutter splint, was NVI status post splint placement.  She was advised to sit on a doughnut for the next few weeks, follow-up outpatient closely with orthopedics given her ulnar fracture.  Advised Tylenol  and Motrin  for pain control, Percocet prescribed for breakthrough pain.  All questions answered, referral placed for follow-up, stable for discharge.     Final diagnoses:  Closed fracture of distal end of right ulna, unspecified fracture morphology, initial encounter  Fracture of coccyx, initial encounter for closed fracture [facility])    ED Discharge Orders          Ordered    AMB referral to orthopedics        03/05/24 1254    oxyCODONE -acetaminophen  (PERCOCET/ROXICET) 5-325 MG tablet  Every 6 hours PRN        03/05/24 1345               Jerrol Agent, MD 03/05/24 1348

## 2024-03-05 NOTE — Discharge Instructions (Signed)
 You fractured one of the bones of your wrist for which you been placed in a splint.  Recommend you follow-up outpatient with orthopedics for continued management.  Recommend Tylenol  and ibuprofen  for pain control, Norco has been provided for breakthrough pain.  Additionally, you did fracture your tailbone.  Recommend sitting on a doughnut for the next few weeks while this fracture heals.

## 2024-03-09 DIAGNOSIS — Z419 Encounter for procedure for purposes other than remedying health state, unspecified: Secondary | ICD-10-CM | POA: Diagnosis not present

## 2024-03-11 ENCOUNTER — Other Ambulatory Visit: Payer: Self-pay | Admitting: Nurse Practitioner

## 2024-03-11 DIAGNOSIS — I10 Essential (primary) hypertension: Secondary | ICD-10-CM

## 2024-03-15 ENCOUNTER — Ambulatory Visit: Admitting: Orthopaedic Surgery

## 2024-03-21 ENCOUNTER — Ambulatory Visit: Admitting: Physician Assistant

## 2024-03-21 ENCOUNTER — Encounter: Payer: Self-pay | Admitting: Physician Assistant

## 2024-03-21 ENCOUNTER — Other Ambulatory Visit: Payer: Self-pay

## 2024-03-21 DIAGNOSIS — M25531 Pain in right wrist: Secondary | ICD-10-CM | POA: Diagnosis not present

## 2024-03-21 DIAGNOSIS — M533 Sacrococcygeal disorders, not elsewhere classified: Secondary | ICD-10-CM | POA: Diagnosis not present

## 2024-03-21 MED ORDER — HYDROCODONE-ACETAMINOPHEN 5-325 MG PO TABS: 1.0000 | ORAL_TABLET | Freq: Three times a day (TID) | ORAL | 0 refills | Status: DC | PRN

## 2024-03-21 NOTE — Progress Notes (Signed)
 Office Visit Note   Patient: Savannah Martin           Date of Birth: 1981-01-25           MRN: [id number] Visit Date: 03/21/2024              Requested by: Paseda, Folashade R, FNP [phone] [address] [place],  [place] [address] PCP: Paseda, Folashade R, FNP   Assessment & Plan: Visit Diagnoses:  1. Pain in right wrist   2. Sacrococcygeal disorders, not elsewhere classified     Plan: Impression is right distal third ulnar shaft fracture with minimal displacement and a sacrococcygeal fracture.  In regards to the ulnar shaft fracture, we will continue treating this nonoperatively.  I have reinforced the need to put her back into a sugar-tong splint for which she is agreeable to.  She will elevate for swelling.  I sent in Norco to take for pain.  She will follow-up in 3 weeks for repeat evaluation and x-rays of the right forearm.  In regards to the sacrococcygeal fracture, we will treat this symptomatically.  She will use a doughnut as needed.  The Norco for the wrist will hopefully help with her buttock pain.  At her follow-up in 3 weeks, we will also obtain new x-rays of the sacrum/coccyx.  Follow-Up Instructions: No follow-ups on file.   Orders:  Orders Placed This Encounter  Procedures   XR Wrist Complete Right   Meds ordered this encounter  Medications   HYDROcodone -acetaminophen  (NORCO/VICODIN) 5-325 MG tablet    Sig: Take 1 tablet by mouth 3 (three) times daily as needed for moderate pain (pain score 4-6).    Dispense:  15 tablet    Refill:  0      Procedures: No procedures performed   Clinical Data: No additional findings.   Subjective: Chief Complaint  Patient presents with   Right Wrist - Pain    DOI 03/05/2024   Pelvis - Injury    DOI 03/05/2024    HPI patient is a pleasant 43 year old right-hand-dominant female who comes in today for ED follow-up of right arm and buttock injuries.  On 03/01/2024, she fell landing on her butt and right outstretched  hand.  She was seen in the ED where x-rays of the right forearm showed a distal third ulnar shaft fracture with minimal displacement.  She was placed in a sugar-tong splint.  She did remove this on her own as it was too bulky and she was hitting her elbow on things very frequently.  She has been wearing a Velcro wrist splint since.  She does not feel like this stabilizes her fracture.  She has been having a fair amount of pain which is not relieved with Tylenol  and ibuprofen .  Review of Systems as detailed in HPI.  All others reviewed and are negative.   Objective: Vital Signs: LMP 03/02/2024 (Exact Date)   Physical Exam well-developed and well-nourished female in no acute distress.  Alert and oriented x 3.  Ortho Exam right arm exam: Moderate tenderness along the ulnar shaft.  Mild swelling of the hand.  She does have pain with supination pronation.  She is neurovascularly intact distally.  Sacral exam: Moderate tenderness to the sacrum.  Specialty Comments:  No specialty comments available.  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   CKD stage 3a, GFR 45-59 ml/min (HCC) 07/19/2023   Multinodular goiter 01/18/2023   Left knee pain 11/25/2022   Current smoker 11/25/2022   SOB (shortness of breath) 11/25/2022   Thyroid  nodule 10/18/2022   High blood pressure 10/18/2022   Other hyperparathyroidism (HCC) 10/18/2022   Screening for diabetes mellitus 10/18/2022   Tobacco abuse counseling 10/18/2022   Anxiety and depression 10/18/2022   Chronic midline low back pain 10/18/2022   Low grade squamous intraepithelial lesion on cytologic smear of cervix (LGSIL) 06/24/2014   Family history of aneurysm 10/17/2011   Past  Medical History:  Diagnosis Date   Anxiety    Chronic midline low back pain 10/18/2022   CKD (chronic kidney disease)    CKD (chronic kidney disease), stage III (HCC)    Hyperparathyroidism (HCC)    Hypertension    LGSIL on Pap smear of cervix 2015   Proteinuria     Family History  Problem Relation Age of Onset   Hypertension Mother    Stroke Mother 97   Anuerysm Mother    Hypertension Father    Epilepsy Sister    Bipolar disorder Sister    Cancer - Cervical Sister    Stroke Maternal Grandmother    Breast cancer Neg Hx    Lung cancer Neg Hx     History reviewed. No pertinent surgical history. Social History   Occupational History   Not on file  Tobacco Use   Smoking status: Every Day    Current packs/day: 0.50    Types: Cigarettes   Smokeless tobacco: Never   Tobacco comments:    Started smoking cigarettes at age 62, smokes 0.5 [pack daily.   Vaping Use   Vaping status: Never Used  Substance and Sexual Activity   Alcohol use: Yes    Comment: occasionally   Drug use: Yes    Types: Marijuana    Comment: occasionally   Sexual activity: Yes

## 2024-04-09 DIAGNOSIS — Z419 Encounter for procedure for purposes other than remedying health state, unspecified: Secondary | ICD-10-CM | POA: Diagnosis not present

## 2024-04-12 ENCOUNTER — Other Ambulatory Visit (INDEPENDENT_AMBULATORY_CARE_PROVIDER_SITE_OTHER): Payer: Self-pay

## 2024-04-12 ENCOUNTER — Ambulatory Visit: Admitting: Orthopaedic Surgery

## 2024-04-12 DIAGNOSIS — M533 Sacrococcygeal disorders, not elsewhere classified: Secondary | ICD-10-CM | POA: Diagnosis not present

## 2024-04-12 DIAGNOSIS — M25531 Pain in right wrist: Secondary | ICD-10-CM

## 2024-04-12 NOTE — Progress Notes (Signed)
 Office Visit Note   Patient: Savannah Martin           Date of Birth: 1981-07-19           MRN: [id number] Visit Date: 04/12/2024              Requested by: Paseda, Folashade R, FNP [phone] [address] [place],  [place] [address] PCP: Paseda, Folashade R, FNP   Assessment & Plan: Visit Diagnoses:  1. Sacrococcygeal disorders, not elsewhere classified   2. Pain in right wrist     Plan: History of Present Illness Savannah Martin is a 43 year old female who presents for follow-up care of a wrist injury and tailbone injury.  Six weeks post-injury, her right wrist feels unusual but is improving. Her left hand, with known carpal tunnel arthritis, is experiencing increased symptoms due to compensatory use, described as 'giving out'. She has previously received injections in the left hand.  Physical Exam MUSCULOSKELETAL: No tenderness on palpation of the wrist. No pain on wrist movement.  Tailbone is slightly tender to palpation.  Assessment and Plan Right wrist sprain (healing) Right wrist sprain, six weeks post-injury, healing with no pain on palpation or movement. - Provide removable Velcro wrist brace for public use and as needed. - Advise against brace use at home or during sleep. - Encourage gradual increase in activity and use of hand and wrist as symptoms improve.  Coccygeal injury improving symptomatically.  No additional treatment necessary.  Follow-Up Instructions: Return in about 6 weeks (around 05/24/2024) for with lindsey.   Orders:  Orders Placed This Encounter  Procedures   XR Forearm Right   XR Sacrum/Coccyx   No orders of the defined types were placed in this encounter.     Procedures: No procedures performed   Clinical Data: No additional findings.   Subjective: Chief Complaint  Patient presents with   Right Wrist - Follow-up   sacrococcygeal     HPI  Review of Systems  Constitutional: Negative.   HENT: Negative.    Eyes: Negative.    Respiratory: Negative.    Cardiovascular: Negative.   Endocrine: Negative.   Musculoskeletal: Negative.   Neurological: Negative.   Hematological: Negative.   Psychiatric/Behavioral: Negative.    All other systems reviewed and are negative.    Objective: Vital Signs: There were no vitals taken for this visit.  Physical Exam Vitals and nursing note reviewed.  Constitutional:      Appearance: She is well-developed.  HENT:     Head: Atraumatic.     Nose: Nose normal.  Eyes:     Extraocular Movements: Extraocular movements intact.  Cardiovascular:     Pulses: Normal pulses.  Pulmonary:     Effort: Pulmonary effort is normal.  Abdominal:     Palpations: Abdomen is soft.  Musculoskeletal:     Cervical back: Neck supple.  Skin:    General: Skin is warm.     Capillary Refill: Capillary refill takes less than 2 seconds.  Neurological:     Mental Status: She is alert. Mental status is at baseline.  Psychiatric:        Behavior: Behavior normal.        Thought Content: Thought content normal.        Judgment: Judgment normal.     Ortho Exam  Specialty Comments:  No specialty comments available.  Imaging: XR Forearm Right Result Date: 04/12/2024 X-rays of the right forearm show progressive healing to the distal ulna shaft  fracture.  Callus formation is present.  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   CKD stage 3a, GFR 45-59 ml/min (HCC) 07/19/2023   Multinodular goiter 01/18/2023   Left knee pain 11/25/2022   Current smoker 11/25/2022   SOB (shortness of breath) 11/25/2022   Thyroid nodule 10/18/2022   High blood pressure 10/18/2022    Other hyperparathyroidism (HCC) 10/18/2022   Screening for diabetes mellitus 10/18/2022   Tobacco abuse counseling 10/18/2022   Anxiety and depression 10/18/2022   Chronic midline low back pain 10/18/2022   Low grade squamous intraepithelial lesion on cytologic smear of cervix (LGSIL) 06/24/2014   Family history of aneurysm 10/17/2011   Past Medical History:  Diagnosis Date   Anxiety    Chronic midline low back pain 10/18/2022   CKD (chronic kidney disease)    CKD (chronic kidney disease), stage III (HCC)    Hyperparathyroidism (HCC)    Hypertension    LGSIL on Pap smear of cervix 2015   Proteinuria     Family History  Problem Relation Age of Onset   Hypertension Mother    Stroke Mother 54   Anuerysm Mother    Hypertension Father    Epilepsy Sister    Bipolar disorder Sister    Cancer - Cervical Sister    Stroke Maternal Grandmother    Breast cancer Neg Hx    Lung cancer Neg Hx     No past surgical history on file. Social History   Occupational History   Not on file  Tobacco Use   Smoking status: Every Day    Current packs/day: 0.50    Types: Cigarettes   Smokeless tobacco: Never   Tobacco comments:    Started smoking cigarettes at age 48, smokes 0.5 [pack daily.   Vaping Use   Vaping status: Never Used  Substance and Sexual Activity   Alcohol use: Yes    Comment: occasionally   Drug use: Yes    Types: Marijuana    Comment: occasionally   Sexual activity: Yes

## 2024-04-22 ENCOUNTER — Ambulatory Visit: Payer: Self-pay | Admitting: *Deleted

## 2024-04-22 NOTE — Telephone Encounter (Signed)
 Copied from CRM [id number]. Topic: Clinical - Red Word Triage >> Apr 22, 2024  2:01 PM Antwanette L wrote: Red Word that prompted transfer to Nurse Triage: pt is having swelling and bruises  in her right knee Reason for Disposition  [1] Very swollen joint AND [2] no fever  Answer Assessment - Initial Assessment Questions 1. LOCATION and RADIATION: Where is the pain located?      I'm having pain in my right knee.   My knee is giving out on me that started 2 weeks ago.  It started randomly.   It swells up then goes down and I'm having bruises around the knee.   I don't know where the bruises are coming from.   The swelling is up and down.    I think I have a URI and I feel bad.   I need to come in for med management too. 2. QUALITY: What does the pain feel like?  (e.g., sharp, dull, aching, burning)     It actually hurts.   3. SEVERITY: How bad is the pain? What does it keep you from doing?   (Scale 1-10; or mild, moderate, severe)     Moderate pain 4. ONSET: When did the pain start? Does it come and go, or is it there all the time?     2 weeks 5. RECURRENT: Have you had this pain before? If Yes, ask: When, and what happened then?     No  6. SETTING: Has there been any recent work, exercise or other activity that involved that part of the body?      No 7. AGGRAVATING FACTORS: What makes the knee pain worse? (e.g., walking, climbing stairs, running)     Putting any pressure on it. 8. ASSOCIATED SYMPTOMS: Is there any swelling or redness of the knee?     It swells up and down 9. OTHER SYMPTOMS: Do you have any other symptoms? (e.g., calf pain, chest pain, difficulty breathing, fever)     I think I have a URI.   I'm coughing, burning sensation in my chest.   I was spitting up mucus.    No fever.   The sore throat went away.    10. PREGNANCY: Is there any chance you are pregnant? When was your last menstrual period?       N/A  Protocols used: Knee Pain-A-AH  Pt put  on wait list in case of a cancellation.   Please call.   Does not use MyChart. She is c/o chest congestion and coughing.  Referred to urgent care today which she was agreeable to going since no sooner appts until middle of Sept. 2025.       FYI Only or Action Required?: FYI only for provider.  Patient was last seen in primary care on 12/25/2023 by Paseda, Folashade R, FNP.  Called Nurse Triage reporting Knee Pain.Right knee pain and swelling intermittently with bruising on each side.  No injuries or falls.  Symptoms began several weeks ago. 2 weeks ago started giving out on her  Interventions attempted: OTC medications: Tylenol  and Ibuprofen   and Rest, hydration, or home remedies.  Symptoms are: gradually worsening.  Triage Disposition: See Physician Within 24 Hours  Patient/caregiver understands and will follow disposition?: Yes

## 2024-04-25 DIAGNOSIS — N1832 Chronic kidney disease, stage 3b: Secondary | ICD-10-CM | POA: Diagnosis not present

## 2024-04-25 DIAGNOSIS — E2609 Other primary hyperaldosteronism: Secondary | ICD-10-CM | POA: Diagnosis not present

## 2024-04-25 DIAGNOSIS — I129 Hypertensive chronic kidney disease with stage 1 through stage 4 chronic kidney disease, or unspecified chronic kidney disease: Secondary | ICD-10-CM | POA: Diagnosis not present

## 2024-04-25 DIAGNOSIS — E210 Primary hyperparathyroidism: Secondary | ICD-10-CM | POA: Diagnosis not present

## 2024-04-25 DIAGNOSIS — E876 Hypokalemia: Secondary | ICD-10-CM | POA: Diagnosis not present

## 2024-05-01 ENCOUNTER — Encounter: Admitting: Radiology

## 2024-05-07 DIAGNOSIS — E210 Primary hyperparathyroidism: Secondary | ICD-10-CM | POA: Diagnosis not present

## 2024-05-09 ENCOUNTER — Telehealth: Payer: Self-pay

## 2024-05-09 DIAGNOSIS — E875 Hyperkalemia: Secondary | ICD-10-CM

## 2024-05-10 DIAGNOSIS — Z419 Encounter for procedure for purposes other than remedying health state, unspecified: Secondary | ICD-10-CM | POA: Diagnosis not present

## 2024-05-16 ENCOUNTER — Ambulatory Visit: Payer: Self-pay | Admitting: Nurse Practitioner

## 2024-05-17 ENCOUNTER — Telehealth: Payer: Self-pay | Admitting: *Deleted

## 2024-05-17 NOTE — Progress Notes (Signed)
 Complex Care Management Note Care Guide Note  05/17/2024 Name: Savannah Martin MRN: [id number] DOB: 08-11-1981   Complex Care Management Outreach Attempts: An unsuccessful telephone outreach was attempted today to offer the patient information about available complex care management services.  Follow Up Plan:  Additional outreach attempts will be made to offer the patient complex care management information and services.   Encounter Outcome:  No Answer  Harlene Satterfield  [facility]  [facility], [facility] Guide  Direct Dial: [phone]  Fax [phone]

## 2024-05-21 NOTE — Progress Notes (Signed)
 Complex Care Management Note  Care Guide Note 05/21/2024 Name: Arcenia Scarbro MRN: [id number] DOB: 1980-12-17  Viktorya Spizzirri is a 43 y.o. year old female who sees Paseda, Folashade R, FNP for primary care. I reached out to [facility] by phone today to offer complex care management services.  Ms. Wickey was given information about Complex Care Management services today including:   The Complex Care Management services include support from the care team which includes your Nurse Care Manager, Clinical Social Worker, or Pharmacist.  The Complex Care Management team is here to help remove barriers to the health concerns and goals most important to you. Complex Care Management services are voluntary, and the patient may decline or stop services at any time by request to their care team member.   Complex Care Management Consent Status: Patient agreed to services and verbal consent obtained.   Follow up plan:  Telephone appointment with complex care management team member scheduled for:  05/24/24  Encounter Outcome:  Patient Scheduled  Harlene Satterfield  [facility]  [facility], [facility] Guide  Direct Dial: [phone]  Fax [phone]

## 2024-05-24 ENCOUNTER — Other Ambulatory Visit: Payer: Self-pay | Admitting: *Deleted

## 2024-05-24 ENCOUNTER — Ambulatory Visit: Admitting: Physician Assistant

## 2024-05-24 NOTE — Patient Instructions (Signed)
 Savannah Martin - I am sorry I was unable to reach you today for our scheduled appointment. I work with Paseda, Folashade R, FNP and am calling to support your healthcare needs. Please contact me at [phone] at your earliest convenience. I look forward to speaking with you soon.   Thank you,  Emine Lopata, RN, BSN, ACM RN Care Manager [employer] [phone]

## 2024-05-27 ENCOUNTER — Telehealth: Payer: Self-pay | Admitting: *Deleted

## 2024-05-27 NOTE — Progress Notes (Unsigned)
 Complex Care Management Care Guide Note  05/27/2024 Name: Savannah Martin MRN: [id number] DOB: Jan 16, 1981  Savannah Martin is a 43 y.o. year old female who is a primary care patient of Paseda, Folashade R, FNP and is actively engaged with the care management team. I reached out to [facility] by phone today to assist with re-scheduling  with the RN Case Manager.  Follow up plan: Unsuccessful telephone outreach attempt made. A HIPAA compliant phone message was left for the patient providing contact information and requesting a return call.  Harlene Satterfield  [employer] [facility]  [facility], [facility] Guide  Direct Dial: [phone]  Fax [phone]

## 2024-05-29 NOTE — Progress Notes (Signed)
 Complex Care Management Care Guide Note  05/29/2024 Name: Savannah Martin MRN: [id number] DOB: 1981/08/09  Savannah Martin is a 43 y.o. year old female who is a primary care patient of Paseda, Folashade R, FNP and is actively engaged with the care management team. I reached out to [facility] by phone today to assist with re-scheduling  with the RN Case Manager.  Follow up plan: Telephone appointment with complex care management team member scheduled for:  06/07/24 with RNCM   Harlene [facility]  [facility], [facility] Guide  Direct Dial: [phone]  Fax [phone]

## 2024-06-07 ENCOUNTER — Other Ambulatory Visit: Payer: Self-pay | Admitting: *Deleted

## 2024-06-07 NOTE — Patient Instructions (Signed)
 Reylynn Sass - I am sorry I was unable to reach you today for our scheduled appointment. I work with Paseda, Folashade R, FNP and am calling to support your healthcare needs. Please contact me at [phone] at your earliest convenience. I look forward to speaking with you soon.   Thank you,  Emine Lopata, RN, BSN, ACM RN Care Manager [employer] [phone]

## 2024-06-09 DIAGNOSIS — Z419 Encounter for procedure for purposes other than remedying health state, unspecified: Secondary | ICD-10-CM | POA: Diagnosis not present

## 2024-06-14 ENCOUNTER — Telehealth: Payer: Self-pay | Admitting: *Deleted

## 2024-06-14 NOTE — Progress Notes (Signed)
 Complex Care Management Care Guide Note  06/14/2024 Name: Savannah Martin MRN: [id number] DOB: 1981/03/16  Savannah Martin is a 43 y.o. year old female who is a primary care patient of Paseda, Folashade R, FNP and is actively engaged with the care management team. I reached out to [facility] by phone today to assist with re-scheduling  with the RN Case Manager.  Follow up plan: Patient declines to reschedule call with [facility] for CCM. Will close outreaches. No further outreaches will be made at this time.   Harlene Satterfield  [facility]  Value-Based Care Institute, [facility] Guide  Direct Dial: [phone]  Fax [phone]

## 2024-07-19 ENCOUNTER — Other Ambulatory Visit: Payer: Self-pay | Admitting: Nurse Practitioner

## 2024-07-19 DIAGNOSIS — I10 Essential (primary) hypertension: Secondary | ICD-10-CM

## 2024-07-19 NOTE — Telephone Encounter (Signed)
 Please contact pt to make an appt Thank you.   Suzen Shove   CMA II

## 2024-07-31 ENCOUNTER — Encounter: Payer: Self-pay | Admitting: Nurse Practitioner

## 2024-07-31 ENCOUNTER — Ambulatory Visit (INDEPENDENT_AMBULATORY_CARE_PROVIDER_SITE_OTHER): Payer: Self-pay | Admitting: Nurse Practitioner

## 2024-07-31 VITALS — BP 145/80 | HR 82 | Wt 197.0 lb

## 2024-07-31 DIAGNOSIS — Z1231 Encounter for screening mammogram for malignant neoplasm of breast: Secondary | ICD-10-CM

## 2024-07-31 DIAGNOSIS — N1831 Chronic kidney disease, stage 3a: Secondary | ICD-10-CM

## 2024-07-31 DIAGNOSIS — K5900 Constipation, unspecified: Secondary | ICD-10-CM | POA: Diagnosis not present

## 2024-07-31 DIAGNOSIS — G4700 Insomnia, unspecified: Secondary | ICD-10-CM | POA: Diagnosis not present

## 2024-07-31 DIAGNOSIS — G8929 Other chronic pain: Secondary | ICD-10-CM

## 2024-07-31 DIAGNOSIS — I10 Essential (primary) hypertension: Secondary | ICD-10-CM

## 2024-07-31 DIAGNOSIS — F17200 Nicotine dependence, unspecified, uncomplicated: Secondary | ICD-10-CM

## 2024-07-31 DIAGNOSIS — M545 Low back pain, unspecified: Secondary | ICD-10-CM | POA: Diagnosis not present

## 2024-07-31 DIAGNOSIS — F419 Anxiety disorder, unspecified: Secondary | ICD-10-CM | POA: Insufficient documentation

## 2024-07-31 DIAGNOSIS — F321 Major depressive disorder, single episode, moderate: Secondary | ICD-10-CM | POA: Diagnosis not present

## 2024-07-31 DIAGNOSIS — E212 Other hyperparathyroidism: Secondary | ICD-10-CM | POA: Diagnosis not present

## 2024-07-31 DIAGNOSIS — Z0000 Encounter for general adult medical examination without abnormal findings: Secondary | ICD-10-CM | POA: Insufficient documentation

## 2024-07-31 MED ORDER — TRAZODONE HCL 50 MG PO TABS: 25.0000 mg | ORAL_TABLET | Freq: Every evening | ORAL | 3 refills | Status: AC | PRN

## 2024-07-31 NOTE — Assessment & Plan Note (Signed)
 Smokes about 6 to 7 cigarettes day  Asked about quitting: confirms that she currently smokes cigarettes Advise to quit smoking: Educated about QUITTING to reduce the risk of cancer, cardio and cerebrovascular disease. Assess willingness: Unwilling to quit at this time, but is working on cutting back. Assist with counseling and pharmacotherapy: Counseled for 3 minutes and literature provided. Arrange for follow up: follow up in 4 months and continue to offer help.

## 2024-07-31 NOTE — Patient Instructions (Signed)
 1. Primary hypertension (Primary)  - CMP14+EGFR  2. Screening mammogram for breast cancer  - MM 3D SCREENING MAMMOGRAM BILATERAL BREAST; Future Please call [phone]   to schedule your mammogram.  [facility]. [address] [place], [place] [address]. [place] .    3. Insomnia, unspecified type  - traZODone (DESYREL) 50 MG tablet; Take 0.5-1 tablets (25-50 mg total) by mouth at bedtime as needed for sleep.  Dispense: 30 tablet; Refill: 3 - Home sleep test; Future Please maintain simple sleep hygiene. - Maintain dark and non-noisy environment in the bedroom. - Please use the bedroom for sleep and sexual activity only. - Do not use electronic devices in the bedroom. - Please take dinner at least 2 hours before bedtime. - Please avoid caffeinated products in the evening, including coffee, soft drinks. - Please try to maintain the regular sleep-wake cycle - Go to bed and wake up at the same time.  4. Current moderate episode of major depressive disorder, unspecified whether recurrent (HCC)  - AMB Referral VBCI Care Management  5. Anxiety  - AMB Referral VBCI Care Management  6. Tobacco use disorder   7. CKD stage 3a, GFR 45-59 ml/min (HCC)    Behavioral Health Resources:    What if I or someone I know is in crisis?   If you are thinking about harming yourself or having thoughts of suicide, or if you know someone who is, seek help right away.   Call your doctor or mental health care provider.   Call 911 or go to a hospital emergency room to get immediate help, or ask a friend or family member to help you do these things; IF YOU ARE IN [place], YOU MAY GO TO WALK-IN URGENT CARE 24/7 at [facility] (see below)   Call the [place]  National Suicide Prevention Lifeline's toll-free, 24-hour hotline at 1-800-273-TALK 518-736-2839) or TTY: 1-800-799-4 TTY 762 066 6656) to talk to a trained counselor.    If you are in crisis, make sure you are not left alone.    If someone else is in crisis, make sure he or she is not left alone     24 Hour :    [facility]  [address], [place], [place] [address] [phone] or [phone] WALK-IN URGENT CARE 24/7   [facility] Mobile Crisis: [phone]   [place]  National Suicide Hotline: 385-724-2949   [facility] the [employer] (Domestic Violence, Rape & Victim Assistance)  [phone]   [facility]  [address], [place]  [address]   [phone] or [phone]    [facility] [employer] Crisis Services: [phone] (8am-4pm) or [phone] (after hours)          [facility], [address] [place], [place]  [phone] Fax: [phone] [email or web address] *Interpreters available *Accepts all insurance and uninsured for Urgent Care needs *Accepts Medicaid and uninsured for outpatient treatment    [facility]   Mon-Fri: 8am-5pm [address], [place], [place] [phone](eynwz); [phone](fax) [email or web address]  *Accepts Medicare   [facility] Pablo Earlean Everts, Fri: 8am-4pm [address], [place], [place] [address] [phone] (phone); [phone] (fax) [email or web address]  *Accepts Medicare   [facility] Mon-Fri: 9am-5pm  [address], [place], [place] [phone] (phone); [phone]  [email or web address]  *Accepts Medicaid   [facility] [place], 8:30am-12pm/1pm-2:30pm [address]  [address],  [place], [place] [phone] (phone); [phone] (fax) www.[email or web address]  *Accepts Medicaid, sliding-scale*Bilingual services available   [facility] Mon-Fri, 8am-7pm [address], [place], [place]  [phone](eynwz);  [phone](fax) www.[email or web address]  *Accepts Medicaid *Bilingual services available   [facility] Mon-Fri: 8am-5pm, Saturday by appointment only [address] [place], [place], [place] [phone] (phone); [phone] (fax) [email or web address]    [facility] [address], [place], [place] [phone] [email or web address]  *Free & reduced services for uninsured and underinsured individuals *Bilingual services for Spanish-speaking clients 21 and under   [facility], [address], [place], [place] [phone](eynwz); [phone](fax) [email or web address]  *Bring your own interpreter at first visit *Accepts Medicare and [facility]   [facility] Mon-Fri: 9am-5:30pm [address], [place], [place] [phone] (phone), [phone] (fax) After hours crisis line: [phone] [email or web address]  *Accepts Medicare and Medicaid   [employer], 8am-6pm [address], [place], [place]  [phone] (phone); [phone] (fax) [email or web address]://[email or web address]  *Subsidized costs available   [facility] Mon-Fri: 8am-4pm [address], [place], [place] [phone](eynwz); [phone](fax) [email or web address].[email or web address]  *Accepts Medicaid   [facility] Same day access hours: Mon-Fri, 8:30-3pm Crisis hours: Mon-Fri, 8am-5pm [address] [place], [place] [phone]   [facility] [employer] Same day access hours: Mon-Fri, 8:30-3pm Crisis hours: Mon-Fri, 8am-8pm [address], [place], [place] [phone] (phone); [phone] (fax) www.[email or web address]  *Accepts Medicaid and Medicare   [facility] [place], [place], Fri: 9am-9pm Tues, Thurs: 9am-6pm [address] [place], [place], [place]  [phone] (phone); [phone]  (fax) [email or web address]  *(Accepts Medicare and Medicaid; payment plans available)*Bilingual services available   [facility] [address] [place], [place] [phone] (phone); [phone] (fax) [email or web address]    [facility] [address], [place], [place]  [phone]  [email or web address]  *Bilingual services available   [facility]) Mon-Thurs: 8am-8pm [address], [place], [place] [phone] (phone); [phone] (fax) [email or web address]  *Accepts Medicaid*Bilingual services available   [facility] [address] [place], [place] [phone] (phone) [email or web address]  *Accepts Medicaid *Bilingual services available   [facility] Mon-Fri, 9am-4:45pm [address], [place], [place] [phone] (phone); [phone] (fax) [email or web address]  *Accepts Medicare   [facility] Mon-Thurs: 8:30-8pm, Fri: 8:30am-7pm [address] [place], [place] (3rd floor) [phone] (phone); [phone] (fax) [email or web address]  *Accepts Medicaid; income-based reduced rates available   [facility] Mon-Fri: 8am-5pm [address], [place], [place] [address] [phone] (phone); [phone] (fax) [email or web address]  *Accepts Medicaid*Bilingual services available     [facility] [place])  [address] [place] [phone] www.[email or web address]  *Provides direct services to individuals in recovery from mental illness, including support groups, recovery skills classes, and one on one peer support   [employer] on Mental Illness) [place] helpline: 915-027-2766  [employer] [place] helpline: 701-309-8020 https://[email or web address]  *A community hub for information relating to local resources and services  for the friends and families of individuals living alongside a mental health condition, as well as the individuals themselves. Classes and support groups also provided      It is important that you exercise regularly at least 30 minutes 5 times a week as tolerated  Think about what you will eat, plan ahead. Choose  clean, green, fresh or frozen over canned, processed or packaged foods which are more sugary, salty and fatty. 70 to 75% of food eaten should be vegetables and fruit. Three meals at set times with snacks  allowed between meals, but they must be fruit or vegetables. Aim to eat over a 12 hour period , example 7 am to 7 pm, and STOP after  your last meal of the day. Drink water,generally about 64 ounces per day, no other drink is as healthy. Fruit juice is best enjoyed in a healthy way, by EATING the fruit.  Thanks for choosing [facility] we consider it a privelige to serve you.

## 2024-07-31 NOTE — Assessment & Plan Note (Addendum)
    07/31/2024   10:51 AM 12/25/2023    2:26 PM 11/25/2022    1:27 PM 10/18/2022   12:18 PM  GAD 7 : Generalized Anxiety Score  Nervous, Anxious, on Edge 2 1 1 2   Control/stop worrying 2 3 3 3   Worry too much - different things 2 3 3 3   Trouble relaxing 2 2 1 3   Restless 0 0 1 3  Easily annoyed or irritable 3 3 3 3   Afraid - awful might happen 0 0 1 0  Total GAD 7 Score 11 12 13 17   Anxiety Difficulty Not difficult at all Somewhat difficult Somewhat difficult Very difficult  Not on medication Patient referred for counseling Currently denies SI, HI

## 2024-07-31 NOTE — Assessment & Plan Note (Addendum)
    07/31/2024   10:50 AM 12/25/2023    2:25 PM 10/18/2022   11:31 AM  Depression screen PHQ 2/9  Decreased Interest 2 0 0  Down, Depressed, Hopeless 2 1 0  PHQ - 2 Score 4 1 0  Altered sleeping 3    Tired, decreased energy 0    Change in appetite 0    Feeling bad or failure about yourself  0    Trouble concentrating 2    Moving slowly or fidgety/restless 2    Suicidal thoughts 0    PHQ-9 Score 11    Difficult doing work/chores Somewhat difficult     Depression and anxiety Exacerbated by personal stressors. Zoloft  discontinued due to inefficacy and digestive issues. Counseling recommended. - Referred to counseling or provided therapist list. - Discussed therapy benefits for stress and anxiety management.

## 2024-07-31 NOTE — Assessment & Plan Note (Addendum)
 BP Readings from Last 3 Encounters:  07/31/24 (!) 145/80  03/05/24 (!) 144/88  03/04/24 (!) 148/87  Chronic uncontrolled hypertension  Hypertension with reading 145/80. Blood pressure management critical due to CKD. Hydralazine  discontinued due to side effects. - Continue current antihypertensive regimen.  Clonidine  0.2 mg twice daily, carvedilol  25 mg twice daily, furosemide  40 mg daily, spironolactone  50 mg daily - Encouraged lifestyle modifications: Moderate exercise 30 min, 5 days/week, heart healthy low-salt, low-fat diet. - Encouraged weight loss for blood pressure control. Maintain close follow-up with nephrologist

## 2024-07-31 NOTE — Assessment & Plan Note (Signed)
 Continue Flexeril  5 mg 3 times daily as needed, gabapentin  100 mg 3 times daily Chronic low back pain Managed with gabapentin  and Flexeril  as needed. - Continue gabapentin  100 mg three times daily. - Use Flexeril  5 mg as needed.

## 2024-07-31 NOTE — Assessment & Plan Note (Signed)
  Chronic constipation managed with Senokal as needed. - Continue Senokal as needed.

## 2024-07-31 NOTE — Assessment & Plan Note (Signed)
-   Ordered mammogram. - Plan for Pap smear at next visit. - Encouraged flu shot and pneumonia vaccine.

## 2024-07-31 NOTE — Progress Notes (Signed)
 Established Patient Office Visit  Subjective:  Patient ID: Savannah Martin, female    DOB: November 29, 1980  Age: 43 y.o. MRN: [id number]  CC:  Chief Complaint  Patient presents with   Hypertension    HPI   Discussed the use of AI scribe software for clinical note transcription with the patient, who gave verbal consent to proceed.  History of Present Illness Savannah Martin is a 43 year old female  has a past medical history of Anxiety, Chronic midline low back pain (10/18/2022), CKD (chronic kidney disease), CKD (chronic kidney disease), stage III (HCC), Hyperparathyroidism, Hypertension, LGSIL on Pap smear of cervix (2015), and Proteinuria.  Patient seen for follow-up for her chronic medical conditions  She has a history of hypertension and chronic kidney disease, with regular follow-ups with kidney specialist at [facility]. Her last appointment was in August 2025, showing an EGFR of 43, creatinine of 1.52, potassium of 4.2, sodium of 136, and calcium of 9.3. Her medications have remained stable except for stopping hydralazine  due to dizziness and sweating. Current medications include clonidine  0.2 mg twice daily, diltiazem  240 mg daily, carvedilol  25 mg twice daily, furosemide  40 mg daily, spironolactone  50 mg daily,    She experiences significant anxiety and depression, worsened by recent family stressors, including one child being shot and another involved in substance use. She feels irritable and describes her anxiety and depression as 'going crazy.' She has not engaged in therapy but is considering it for herself and her children.  She no longer takes Zoloft   She has sleep disturbances, with difficulty staying asleep and frequent awakenings. She has a history of using Ambien and trazodone for sleep, but these issues have returned. She snores heavily, and her sister has noted episodes of apnea during sleep. Working two shifts contributes to her sleep issues, and she feels  'stretched out all over the place.'  She smokes six to seven cigarettes a day, having reduced from half a pack.   She had parathyroidectomy in May 2025 for primary hyperparathyroidism , stated that she was advised to take 1200 mg of calcium daily.    She has not had a flu shot or mammogram recently and is overdue for a Pap smear.   She reports occasional shortness of breath, managed with an inhaler, and swelling in her knee when she runs out of spironolactone .    Assessment & Plan       Past Medical History:  Diagnosis Date   Anxiety    Chronic midline low back pain 10/18/2022   CKD (chronic kidney disease)    CKD (chronic kidney disease), stage III (HCC)    Hyperparathyroidism    Hypertension    LGSIL on Pap smear of cervix 2015   Proteinuria     History reviewed. No pertinent surgical history.  Family History  Problem Relation Age of Onset   Hypertension Mother    Stroke Mother 62   Anuerysm Mother    Hypertension Father    Epilepsy Sister    Bipolar disorder Sister    Cancer - Cervical Sister    Stroke Maternal Grandmother    Breast cancer Neg Hx    Lung cancer Neg Hx     Social History   Socioeconomic History   Marital status: Single    Spouse name: Not on file   Number of children: 4   Years of education: Not on file   Highest education level: Not on file  Occupational History   Not on  file  Tobacco Use   Smoking status: Every Day    Current packs/day: 0.50    Types: Cigarettes   Smokeless tobacco: Never   Tobacco comments:    Started smoking cigarettes at age 46, smokes 0.5 [pack daily.   Vaping Use   Vaping status: Never Used  Substance and Sexual Activity   Alcohol use: Yes    Comment: occasionally   Drug use: Yes    Types: Marijuana    Comment: occasionally   Sexual activity: Yes  Other Topics Concern   Not on file  Social History Narrative   Lives with her children    Social Drivers of Corporate Investment Banker Strain: Not on  file  Food Insecurity: Not on file  Transportation Needs: Not on file  Physical Activity: Not on file  Stress: Not on file  Social Connections: Not on file  Intimate Partner Violence: Not At Risk (01/26/2024)   [facility]   Humiliation, Afraid, Rape, and Kick questionnaire    Within the last year, have you been afraid of your partner or ex-partner?: No    Within the last year, have you been humiliated or emotionally abused in other ways by your partner or ex-partner?: No    Within the last year, have you been kicked, hit, slapped, or otherwise physically hurt by your partner or ex-partner?: No    Within the last year, have you been raped or forced to have any kind of sexual activity by your partner or ex-partner?: No    Outpatient Medications Prior to Visit  Medication Sig Dispense Refill   albuterol  (VENTOLIN  HFA) 108 (90 Base) MCG/ACT inhaler Inhale 2 puffs into the lungs every 6 (six) hours as needed for wheezing or shortness of breath. 8 g 0   carvedilol  (COREG ) 25 MG tablet TAKE 1 TABLET (25 MG TOTAL) BY MOUTH 2 (TWO) TIMES DAILY. 180 tablet 0   cloNIDine  (CATAPRES ) 0.2 MG tablet TAKE 1 TABLET (0.2 MG TOTAL) BY MOUTH 2 (TWO) TIMES DAILY. 180 tablet 1   cyclobenzaprine  (FLEXERIL ) 5 MG tablet Take 1 tablet (5 mg total) by mouth 3 (three) times daily as needed for muscle spasms. 30 tablet 1   diclofenac  Sodium (VOLTAREN ) 1 % GEL Apply 4 g topically 4 (four) times daily. 50 g 0   diltiazem  (CARDIZEM  CD) 240 MG 24 hr capsule Take 1 capsule (240 mg total) by mouth daily. 90 capsule 1   furosemide  (LASIX ) 40 MG tablet TAKE 1 TABLET (40 MG TOTAL) BY MOUTH DAILY. 90 tablet 0   gabapentin  (NEURONTIN ) 100 MG capsule TAKE 1 CAPSULE (100 MG TOTAL) BY MOUTH 3 (THREE) TIMES DAILY. 90 capsule 3   senna (SENOKOT) 8.6 MG TABS tablet Take 2 tablets by mouth at bedtime as needed.     spironolactone  (ALDACTONE ) 50 MG tablet Take 1 tablet (50 mg total) by mouth daily. 90 tablet 1    ALPRAZolam (XANAX) 0.5 MG tablet  (Patient not taking: Reported on 07/31/2024)     hydrALAZINE  (APRESOLINE ) 50 MG tablet Take 50 mg by mouth 3 (three) times daily. (Patient not taking: Reported on 07/31/2024)     oxyCODONE -acetaminophen  (PERCOCET/ROXICET) 5-325 MG tablet Take 1 tablet by mouth every 6 (six) hours as needed for severe pain (pain score 7-10). (Patient not taking: Reported on 07/31/2024) 15 tablet 0   Varenicline  Tartrate, Starter, (CHANTIX  STARTING MONTH PAK) 0.5 MG X 11 & 1 MG X 42 TBPK Take as instructed on packaging (Patient not taking:  Reported on 07/31/2024) 1 each 0   HYDROcodone -acetaminophen  (NORCO/VICODIN) 5-325 MG tablet Take 1 tablet by mouth 3 (three) times daily as needed for moderate pain (pain score 4-6). (Patient not taking: Reported on 07/31/2024) 15 tablet 0   methylPREDNISolone  (MEDROL  DOSEPAK) 4 MG TBPK tablet Take with food and as instructed on the packaging (Patient not taking: Reported on 07/31/2024) 1 each 0   sertraline  (ZOLOFT ) 100 MG tablet Take 1 tablet (100 mg total) by mouth daily. (Patient not taking: Reported on 07/31/2024) 90 tablet 1   No facility-administered medications prior to visit.    Allergies  Allergen Reactions   Latex Rash   Sulfa Antibiotics Rash   Penicillins Other (See Comments)   Sulfur Itching    ROS Review of Systems  Constitutional:  Negative for appetite change, chills, fatigue and fever.  HENT:  Negative for congestion, postnasal drip, rhinorrhea and sneezing.   Respiratory:  Negative for cough, shortness of breath and wheezing.   Cardiovascular:  Negative for chest pain, palpitations and leg swelling.  Gastrointestinal:  Negative for abdominal pain, constipation, nausea and vomiting.  Genitourinary:  Negative for difficulty urinating, dysuria, flank pain and frequency.  Musculoskeletal:  Negative for arthralgias, back pain, joint swelling and myalgias.  Skin:  Negative for color change, pallor, rash and wound.  Neurological:   Negative for dizziness, facial asymmetry, weakness, numbness and headaches.  Psychiatric/Behavioral:  Positive for sleep disturbance. Negative for behavioral problems, confusion, self-injury and suicidal ideas.       Objective:    Physical Exam Vitals and nursing note reviewed.  Constitutional:      General: She is not in acute distress.    Appearance: Normal appearance. She is obese. She is not ill-appearing, toxic-appearing or diaphoretic.  Eyes:     General: No scleral icterus.       Right eye: No discharge.        Left eye: No discharge.     Extraocular Movements: Extraocular movements intact.     Conjunctiva/sclera: Conjunctivae normal.  Cardiovascular:     Rate and Rhythm: Normal rate and regular rhythm.     Pulses: Normal pulses.     Heart sounds: Normal heart sounds. No murmur heard.    No friction rub. No gallop.  Pulmonary:     Effort: Pulmonary effort is normal. No respiratory distress.     Breath sounds: Normal breath sounds. No stridor. No wheezing, rhonchi or rales.  Chest:     Chest wall: No tenderness.  Abdominal:     General: There is no distension.     Palpations: Abdomen is soft.     Tenderness: There is no abdominal tenderness. There is no right CVA tenderness, left CVA tenderness or guarding.  Musculoskeletal:        General: No swelling, tenderness, deformity or signs of injury.     Right lower leg: No edema.     Left lower leg: No edema.  Skin:    General: Skin is warm and dry.     Capillary Refill: Capillary refill takes less than 2 seconds.     Coloration: Skin is not jaundiced or pale.     Findings: No bruising, erythema or lesion.  Neurological:     Mental Status: She is alert and oriented to person, place, and time.     Motor: No weakness.     Gait: Gait normal.  Psychiatric:        Mood and Affect: Mood normal.  Behavior: Behavior normal.        Thought Content: Thought content normal.        Judgment: Judgment normal.     BP (!)  145/80   Pulse 82   Wt 197 lb (89.4 kg)   SpO2 100%   BMI 34.90 kg/m  Wt Readings from Last 3 Encounters:  07/31/24 197 lb (89.4 kg)  03/05/24 192 lb (87.1 kg)  03/04/24 192 lb (87.1 kg)    No results found for: TSH No results found for: WBC, HGB, HCT, MCV, PLT Lab Results  Component Value Date   NA 140 12/25/2023   K 5.2 12/25/2023   CO2 17 (L) 12/25/2023   GLUCOSE 97 12/25/2023   BUN 24 12/25/2023   CREATININE 1.64 (H) 12/25/2023   BILITOT 0.5 07/19/2023   ALKPHOS 129 (H) 07/19/2023   AST 19 07/19/2023   ALT 9 07/19/2023   PROT 7.2 07/19/2023   ALBUMIN 3.8 (L) 07/19/2023   CALCIUM 10.6 (H) 12/25/2023   EGFR 39 (L) 12/25/2023   No results found for: CHOL No results found for: HDL No results found for: LDLCALC No results found for: TRIG No results found for: [facility] Lab Results  Component Value Date   HGBA1C 5.3 10/18/2022      Assessment & Plan:   Problem List Items Addressed This Visit       Cardiovascular and Mediastinum   High blood pressure - Primary   BP Readings from Last 3 Encounters:  07/31/24 (!) 145/80  03/05/24 (!) 144/88  03/04/24 (!) 148/87  Chronic uncontrolled hypertension  Hypertension with reading 145/80. Blood pressure management critical due to CKD. Hydralazine  discontinued due to side effects. - Continue current antihypertensive regimen.  Clonidine  0.2 mg twice daily, carvedilol  25 mg twice daily, furosemide  40 mg daily, spironolactone  50 mg daily - Encouraged lifestyle modifications: Moderate exercise 30 min, 5 days/week, heart healthy low-salt, low-fat diet. - Encouraged weight loss for blood pressure control. Maintain close follow-up with nephrologist       Relevant Orders   CMP14+EGFR     Endocrine   Other hyperparathyroidism   Status post parathyroidectomy with calcium supplementation recommended.          Genitourinary   CKD stage 3a, GFR 45-59 ml/min (HCC)    Chronic kidney disease stage 3  with EGFR 43, creatinine 1.52, potassium 4.2. Blood pressure management is crucial. - Continue clonidine , diltiazem , carvedilol , furosemide , spironolactone . - Encouraged adherence to nephrologist's blood pressure management recommendations. - Ordered labs for electrolytes and kidney function. Drink at least 64 ounces of water daily to maintain hydration avoid use of NSAIDs and other nephrotoxic agents         Other   Major depression      07/31/2024   10:50 AM 12/25/2023    2:25 PM 10/18/2022   11:31 AM  Depression screen PHQ 2/9  Decreased Interest 2 0 0  Down, Depressed, Hopeless 2 1 0  PHQ - 2 Score 4 1 0  Altered sleeping 3    Tired, decreased energy 0    Change in appetite 0    Feeling bad or failure about yourself  0    Trouble concentrating 2    Moving slowly or fidgety/restless 2    Suicidal thoughts 0    PHQ-9 Score 11    Difficult doing work/chores Somewhat difficult     Depression and anxiety Exacerbated by personal stressors. Zoloft  discontinued due to inefficacy and digestive issues. Counseling recommended. - Referred to  counseling or provided therapist list. - Discussed therapy benefits for stress and anxiety management.       Relevant Medications   traZODone  (DESYREL ) 50 MG tablet   Other Relevant Orders   AMB Referral VBCI Care Management   Chronic midline low back pain   Continue Flexeril  5 mg 3 times daily as needed, gabapentin  100 mg 3 times daily Chronic low back pain Managed with gabapentin  and Flexeril  as needed. - Continue gabapentin  100 mg three times daily. - Use Flexeril  5 mg as needed.        Relevant Medications   traZODone  (DESYREL ) 50 MG tablet   Tobacco use disorder   Smokes about 6 to 7 cigarettes day  Asked about quitting: confirms that she currently smokes cigarettes Advise to quit smoking: Educated about QUITTING to reduce the risk of cancer, cardio and cerebrovascular disease. Assess willingness: Unwilling to quit at this time,  but is working on cutting back. Assist with counseling and pharmacotherapy: Counseled for 3 minutes and literature provided. Arrange for follow up: follow up in 4 months and continue to offer help.           Constipation    Chronic constipation managed with Senokal as needed. - Continue Senokal as needed.       Anxiety      07/31/2024   10:51 AM 12/25/2023    2:26 PM 11/25/2022    1:27 PM 10/18/2022   12:18 PM  GAD 7 : Generalized Anxiety Score  Nervous, Anxious, on Edge 2 1 1 2   Control/stop worrying 2 3 3 3   Worry too much - different things 2 3 3 3   Trouble relaxing 2 2 1 3   Restless 0 0 1 3  Easily annoyed or irritable 3 3 3 3   Afraid - awful might happen 0 0 1 0  Total GAD 7 Score 11 12 13 17   Anxiety Difficulty Not difficult at all Somewhat difficult Somewhat difficult Very difficult  Not on medication Patient referred for counseling Currently denies SI, HI      Relevant Medications   traZODone  (DESYREL ) 50 MG tablet   Other Relevant Orders   AMB Referral VBCI Care Management   Insomnia   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 Previous use of Ambien and trazodone . - Ordered sleep study for sleep apnea evaluation. - Prescribed trazodone  for sleep. - Advised on sleep hygiene: dark room, avoid TV and phone before bed.       Relevant Medications   traZODone  (DESYREL ) 50 MG tablet   Other Relevant Orders   Home sleep test   Health care maintenance   - Ordered mammogram. - Plan for Pap smear at next visit. - Encouraged flu shot and pneumonia vaccine.      Other Visit Diagnoses       Screening mammogram for breast cancer       Relevant Orders   MM 3D SCREENING MAMMOGRAM BILATERAL BREAST       Meds ordered this encounter  Medications   traZODone  (DESYREL ) 50 MG tablet    Sig: Take 0.5-1 tablets (25-50 mg total) by mouth at bedtime as needed for sleep.    Dispense:  30 tablet    Refill:  3    Follow-up: Return in  about 4 months (around 11/29/2024) for CPE.    Deanglo Hissong R Kimber Esterly, FNP

## 2024-07-31 NOTE — Assessment & Plan Note (Signed)
  Chronic kidney disease stage 3 with EGFR 43, creatinine 1.52, potassium 4.2. Blood pressure management is crucial. - Continue clonidine , diltiazem , carvedilol , furosemide , spironolactone . - Encouraged adherence to nephrologist's blood pressure management recommendations. - Ordered labs for electrolytes and kidney function. Drink at least 64 ounces of water daily to maintain hydration avoid use of NSAIDs and other nephrotoxic agents

## 2024-07-31 NOTE — Assessment & Plan Note (Signed)
 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 Previous use of Ambien and trazodone. - Ordered sleep study for sleep apnea evaluation. - Prescribed trazodone for sleep. - Advised on sleep hygiene: dark room, avoid TV and phone before bed.

## 2024-07-31 NOTE — Assessment & Plan Note (Signed)
 Status post parathyroidectomy with calcium supplementation recommended.

## 2024-08-01 LAB — CMP14+EGFR
ALT: 11 IU/L (ref 0–32)
AST: 17 IU/L (ref 0–40)
Albumin: 3.9 g/dL (ref 3.9–4.9)
Alkaline Phosphatase: 105 IU/L (ref 41–116)
BUN/Creatinine Ratio: 11 (ref 9–23)
BUN: 19 mg/dL (ref 6–24)
Bilirubin Total: 0.4 mg/dL (ref 0.0–1.2)
CO2: 20 mmol/L (ref 20–29)
Calcium: 9.5 mg/dL (ref 8.7–10.2)
Chloride: 104 mmol/L (ref 96–106)
Creatinine, Ser: 1.68 mg/dL — ABNORMAL HIGH (ref 0.57–1.00)
Globulin, Total: 3.2 g/dL (ref 1.5–4.5)
Glucose: 92 mg/dL (ref 70–99)
Potassium: 5.7 mmol/L — ABNORMAL HIGH (ref 3.5–5.2)
Sodium: 137 mmol/L (ref 134–144)
Total Protein: 7.1 g/dL (ref 6.0–8.5)
eGFR: 38 mL/min/1.73 — ABNORMAL LOW (ref >59)

## 2024-08-02 ENCOUNTER — Ambulatory Visit: Payer: Self-pay | Admitting: Nurse Practitioner

## 2024-08-02 ENCOUNTER — Other Ambulatory Visit: Payer: Self-pay | Admitting: Nurse Practitioner

## 2024-08-02 DIAGNOSIS — E875 Hyperkalemia: Secondary | ICD-10-CM

## 2024-08-02 MED ORDER — LOKELMA 10 G PO PACK: 10.0000 g | PACK | Freq: Once | ORAL | 0 refills | Status: AC

## 2024-08-14 ENCOUNTER — Ambulatory Visit

## 2024-08-16 ENCOUNTER — Telehealth: Payer: Self-pay

## 2024-08-16 NOTE — Progress Notes (Signed)
 Complex Care Management Note  Care Guide Note 08/16/2024 Name: [name] MRN: [id number] DOB: 01-Apr-1981  Savannah Martin is a 43 y.o. year old female who sees Paseda, Folashade R, FNP for primary care. I reached out to [facility] by phone today to offer complex care management services.  Ms. Sauerwein was given information about Complex Care Management services today including:   The Complex Care Management services include support from the care team which includes your Nurse Care Manager, Clinical Social Worker, or Pharmacist.  The Complex Care Management team is here to help remove barriers to the health concerns and goals most important to you. Complex Care Management services are voluntary, and the patient may decline or stop services at any time by request to their care team member.   Complex Care Management Consent Status: Patient agreed to services and verbal consent obtained.   Follow up plan:  09/03/24 @ 1 PM  Encounter Outcome:  Patient Scheduled  Leotis [facility] Guide  Direct Dial: [phone]  Fax [phone]

## 2024-09-02 DIAGNOSIS — I10 Essential (primary) hypertension: Secondary | ICD-10-CM

## 2024-09-02 DIAGNOSIS — M545 Low back pain, unspecified: Secondary | ICD-10-CM

## 2024-09-02 DIAGNOSIS — G8929 Other chronic pain: Secondary | ICD-10-CM

## 2024-09-02 NOTE — Telephone Encounter (Signed)
 cyclobenzaprine  (FLEXERIL ) 5 MG tablet [Pharmacy Med Name: CYCLOBENZAPRINE  HCL 5 MG ORAL TABLET]      spironolactone  (ALDACTONE ) 50 MG tablet [Pharmacy Med Name: SPIRONOLACTONE  50 MG ORAL TABLET]

## 2024-09-02 NOTE — Telephone Encounter (Signed)
 Please advise [facility]

## 2024-09-03 ENCOUNTER — Other Ambulatory Visit: Payer: Self-pay | Admitting: Licensed Clinical Social Worker

## 2024-09-03 NOTE — Patient Instructions (Signed)
 Visit Information  Thank you for taking time to visit with me today. Please don't hesitate to contact me if I can be of assistance to you before our next scheduled appointment.  Our next appointment is by telephone on 10/08/2024 at 1;00 PM   Please call the care guide team at [phone] if you need to cancel or reschedule your appointment.   Following is a copy of your care plan:   Goals Addressed             This Visit's Progress    VBCI Social Work Care Plan       Problems:   Mental health needs; anxiety, stress, depression           Housing needs; looking for residence at present          Pain issues           Needs help in managing her needs such as housing, finances, meals  CSW Clinical Goal(s):   Over the next 30  days the Patient will attend all scheduled medical appointments as evidenced by patient report and care team review of appointment completion in electronic medical record.             Over the next 30 days client will consider calling counseling agency of choice to inquire about counseling support AEB client report of same (discussed [facility] and [facility])  Interventions:  LCSW spoke briefly with client today via phone but phone connection was lost. .LCSW returned call to client sister, who is client contact :  Sharene Sharps.  LCSW talked with Sharene Sharps, client contact, via phone today about client needs         Sharene said that client is essentially homeless but is currently staying with Latoya and her family.  Sharene is familiar with housing resources in area and is trying to help Kieu locate a suitable housing location                   Discussed transport needs of client. Discussed medication procurement of client              Sharene said client has sleeping difficulty. Latoya said client has anxiety issues and Sharene feels that client may benefit from counseling support.  LCSW provided Latoya with counseling  agencies locally that may benefit client:  [facility] and [facility]               Discussed trauma history of client. Latoya said client had experienced trauma in the past. LCSW suggested that perhaps counseling sessions could be helpful to [facility] in discussing past trauma incidents.               Discussed program support with RN, LCSW, Pharmacist                Discussed client medical support with NP, Folashade Paseda              Completed needed assessments. Completed GAD-7. Completed PHQ 2/9               Thanked Latoya for call with LCSW today to discuss client needs.                Encouraged client or Eyva Califano to call LCSW as needed to discuss SW needs of client.  Sharene has phone number for LCSW to access as needed.  Patient Goals/Self-Care Activities:  Consider calling counseling agency of choice to inquire about counseling support services for client              Take medications as prescribed             Attend appointments as scheduled with NP              Research housing options in the community            Call LCSW as needed for SW support  Plan:   Telephone follow up appointment with care management team member scheduled for:  10/08/2024 at 1:00 PM         Please go to [facility] [address], [place] [phone]) if you are experiencing a Mental Health or Behavioral Health Crisis or need someone to talk to.  Patient  and Laytoya Tugwell (contact) verbalized understanding of Care plan and visit instructions communicated this visit   Glendia Pear  MSW, LCSW [place] Licensed Clinical Social Worker Direct Dial:  [phone] Fax:  [phone] Website:  [email or web address]

## 2024-09-03 NOTE — Patient Outreach (Signed)
 Complex Care Management   Visit Note  09/03/2024  Name:  Savannah Martin MRN: [id number] DOB: 12-16-1980  Situation: Referral received for Complex Care Management related to anxiety, stress. Mental Health Needs. Housing needs I obtained verbal consent from Patient.  Visit completed with Patient  on the phone. Visit also completed with contact for client, Shyenne Maggard (client sister) on the phone  Background:   Past Medical History:  Diagnosis Date   Anxiety    Chronic midline low back pain 10/18/2022   CKD (chronic kidney disease)    CKD (chronic kidney disease), stage III (HCC)    Hyperparathyroidism    Hypertension    LGSIL on Pap smear of cervix 2015   Proteinuria     Assessment: Patient Reported Symptoms:  Cognitive Cognitive Status: Alert and oriented to person, place, and time, Difficulties with attention and concentration Cognitive/Intellectual Conditions Management [RPT]: None reported or documented in medical history or problem list   Health Maintenance Behaviors: Stress management Health Facilitated by: Stress management  Neurological Neurological Review of Symptoms: Weakness, Dizziness, Headaches, Vision changes (wears glasses) Neurological Management Strategies: Adequate rest, Coping strategies  HEENT HEENT Symptoms Reported: Sore throat HEENT Management Strategies: Coping strategies    Cardiovascular Cardiovascular Symptoms Reported: Dizziness, Fatigue Cardiovascular Management Strategies: Coping strategies  Respiratory Respiratory Symptoms Reported: Shortness of breath, Wheezing Other Respiratory Symptoms: SOB when walking occasionally.  Client uses inhaler as needed Respiratory Management Strategies: Coping strategies  Endocrine Endocrine Symptoms Reported: Weakness or fatigue, Shakiness, Shortness of breath, Headaches    Gastrointestinal Gastrointestinal Symptoms Reported: Reflux/heartburn Additional Gastrointestinal Details: Heartburn; gas;  constipation Gastrointestinal Management Strategies: Coping strategies    Genitourinary Genitourinary Symptoms Reported: Other Other Genitourinary Symptoms: does not go to bathroom very often maybe 1-2 times per day Genitourinary Management Strategies: Coping strategies  Integumentary Integumentary Symptoms Reported: Skin changes Additional Integumentary Details: history of excema and hives Skin Management Strategies: Coping strategies  Musculoskeletal Musculoskelatal Symptoms Reviewed: Weakness, Muscle pain Additional Musculoskeletal Details: pain in back Musculoskeletal Management Strategies: Coping strategies      Psychosocial Psychosocial Symptoms Reported: Sadness - if selected complete PHQ 2-9, Anxiety - if selected complete GAD, Depression - if selected complete PHQ 2-9 Additional Psychological Details: homeless; resides with her sister. Behavioral Management Strategies: Coping strategies Major Change/Loss/Stressor/Fears (CP): Medical condition, self Techniques to Cope with Loss/Stress/Change: Counseling, Diversional activities Quality of Family Relationships: supportive Anxiety issues     09/03/2024    PHQ2-9 Depression Screening   Little interest or pleasure in doing things Several days  Feeling down, depressed, or hopeless Several days  PHQ-2 - Total Score 2  Trouble falling or staying asleep, or sleeping too much More than half the days  Feeling tired or having little energy Several days  Poor appetite or overeating  Several days  Feeling bad about yourself - or that you are a failure or have let yourself or your family down Several days  Trouble concentrating on things, such as reading the newspaper or watching television More than half the days  Moving or speaking so slowly that other people could have noticed.  Or the opposite - being so fidgety or restless that you have been moving around a lot more than usual Several days  Thoughts that you would be better off dead, or  hurting yourself in some way Not at all  PHQ2-9 Total Score 10  If you checked off any problems, how difficult have these problems made it for you to do your work, take care of  things at home, or get along with other people Somewhat difficult  Depression Interventions/Treatment Counseling    Today's Vitals  Client's BP may run high according to Sharene Sharps, contact for client  Pain Scale: 0-10 Pain Score: 5  Pain Type: Chronic pain Pain Location: Head Pain Descriptors / Indicators: Aching Patients Stated Pain Goal: 1 Pain Intervention(s): Relaxation  Medications Reviewed Today    Medication Review Nhi Butrum (MR# [id number]) Review Info  User Date and Time  FRANCES OZELL GORMAN KEN [[id number]] 09/03/2024  2:19 PM   Reviewed Medications As of 09/03/2024 at  2:19 PM senna (SENOKOT) 8.6 MG TABS tablet Taking  albuterol  (VENTOLIN  HFA) 108 (90 Base) MCG/ACT inhaler Taking  Varenicline  Tartrate, Starter, (CHANTIX  STARTING MONTH PAK) 0.5 MG X 11 & 1 MG X 42 TBPK Not Taking  diclofenac  Sodium (VOLTAREN ) 1 % GEL Taking  furosemide  (LASIX ) 40 MG tablet Taking  gabapentin  (NEURONTIN ) 100 MG capsule Taking  hydrALAZINE  (APRESOLINE ) 50 MG tablet Not Taking  diltiazem  (CARDIZEM  CD) 240 MG 24 hr capsule Taking  oxyCODONE -acetaminophen  (PERCOCET/ROXICET) 5-325 MG tablet Not taking  cloNIDine  (CATAPRES ) 0.2 MG tablet Taking  carvedilol  (COREG ) 25 MG tablet Not Marked Taking  traZODone  (DESYREL ) 50 MG tablet Taking  cyclobenzaprine  (FLEXERIL ) 5 MG tablet Taking  spironolactone  (ALDACTONE ) 50 MG tablet Taking    Recommendation:   PCP Follow-up Continue Current Plan of Care Consider calling counseling agencies for counseling support  Call LCSW as needed for SW support  Follow Up Plan:   Telephone follow up appointment date/time:  10/08/2024 at 1:00 PM    Glendia Frances  MSW, LCSW [facility] Licensed Clinical Social Worker Direct  Dial:  [phone] Fax:  [phone] Website:  [email or web address]

## 2024-09-12 ENCOUNTER — Ambulatory Visit

## 2024-09-13 ENCOUNTER — Ambulatory Visit

## 2024-09-23 ENCOUNTER — Other Ambulatory Visit: Payer: Self-pay | Admitting: Nurse Practitioner

## 2024-09-23 DIAGNOSIS — G8929 Other chronic pain: Secondary | ICD-10-CM

## 2024-09-23 NOTE — Telephone Encounter (Signed)
 Please Advise

## 2024-09-24 ENCOUNTER — Ambulatory Visit (HOSPITAL_BASED_OUTPATIENT_CLINIC_OR_DEPARTMENT_OTHER): Admitting: Pulmonary Disease

## 2024-09-24 DIAGNOSIS — G4700 Insomnia, unspecified: Secondary | ICD-10-CM | POA: Insufficient documentation

## 2024-10-08 ENCOUNTER — Telehealth: Admitting: Licensed Clinical Social Worker

## 2024-12-02 ENCOUNTER — Encounter: Payer: Self-pay | Admitting: Nurse Practitioner
